# Patient Record
Sex: Female | Born: 1980 | Race: Asian | Hispanic: No | Marital: Married | Smoking: Never smoker
Health system: Southern US, Academic
[De-identification: ages and names within clinical notes are randomized; demographics above are authoritative.]

## PROBLEM LIST (undated history)

## (undated) DIAGNOSIS — I10 Essential (primary) hypertension: Secondary | ICD-10-CM

## (undated) DIAGNOSIS — N390 Urinary tract infection, site not specified: Secondary | ICD-10-CM

## (undated) DIAGNOSIS — F32A Depression, unspecified: Secondary | ICD-10-CM

## (undated) DIAGNOSIS — R3129 Other microscopic hematuria: Secondary | ICD-10-CM

## (undated) DIAGNOSIS — F419 Anxiety disorder, unspecified: Secondary | ICD-10-CM

## (undated) DIAGNOSIS — N3281 Overactive bladder: Secondary | ICD-10-CM

## (undated) HISTORY — DX: Urinary tract infection, site not specified: N39.0

## (undated) HISTORY — DX: Essential (primary) hypertension: I10

## (undated) HISTORY — DX: Anxiety disorder, unspecified: F41.9

## (undated) HISTORY — DX: Other microscopic hematuria: R31.29

## (undated) HISTORY — DX: Depression, unspecified: F32.A

## (undated) HISTORY — DX: Overactive bladder: N32.81

---

## 2016-12-27 IMAGING — CT CT ABDOMEN & PELVIS W/O DYE
2 of 3 series · 17 of 46 positions shown, 19 images · non-contrast
Comparison: none

Exam:   

CT abdomen and pelvis without contrast
INDICATION: Kidney stone.
TECHNIQUE: A CT scan is performed. Multiple axial images are obtained from the dome of the diaphragm to the symphysis. Radiation dose 732 mGy cm.

[axial · axial · 0.63mm/px · z∈[-222,+87]mm · 14 of 119 slices shown, 16 images]
[im 8/119  soft-tissue]
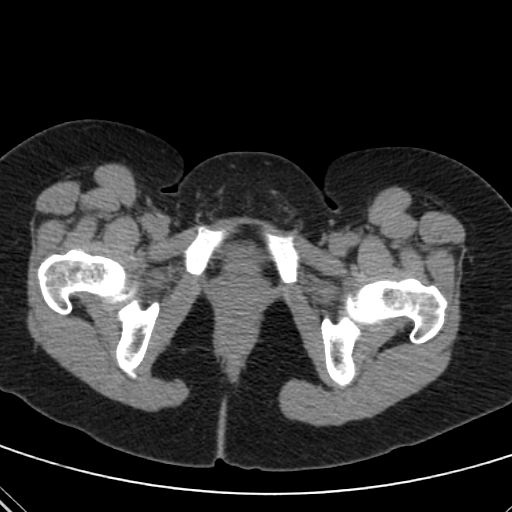
[im 8/119  bone]
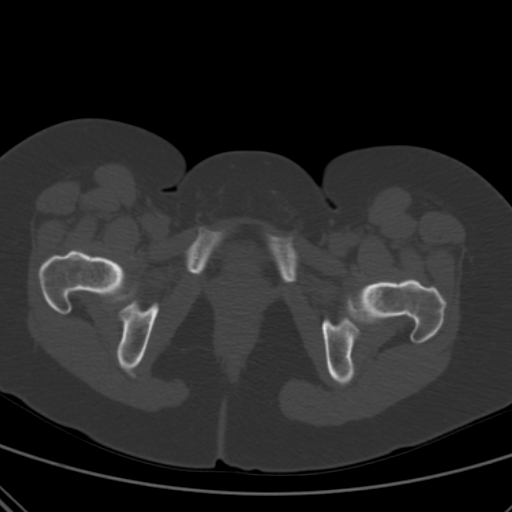
[im 16/119  soft-tissue]
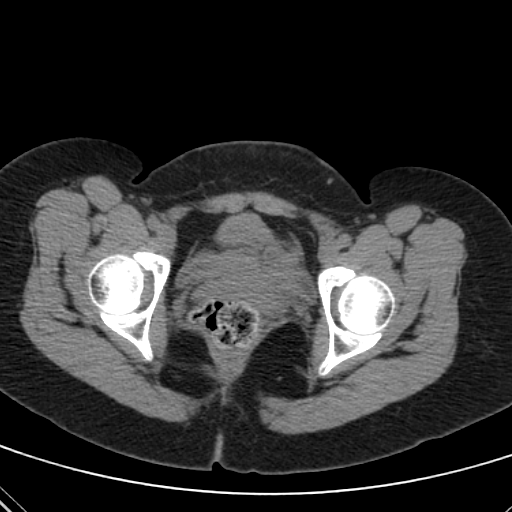
[im 23/119  soft-tissue]
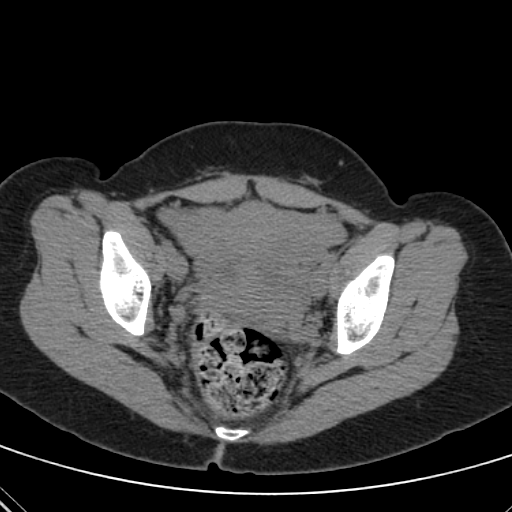
[im 31/119  soft-tissue]
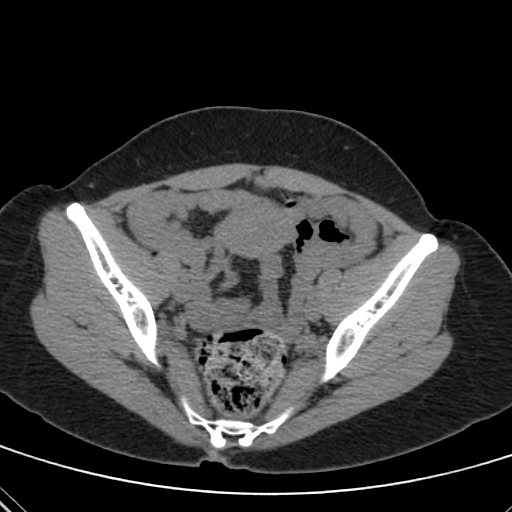
[im 39/119  soft-tissue]
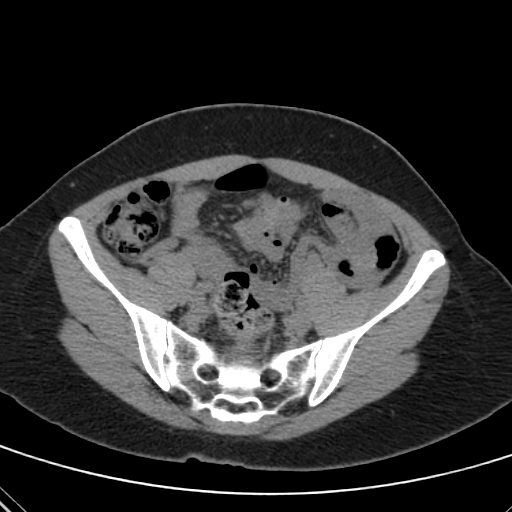
[im 46/119  soft-tissue]
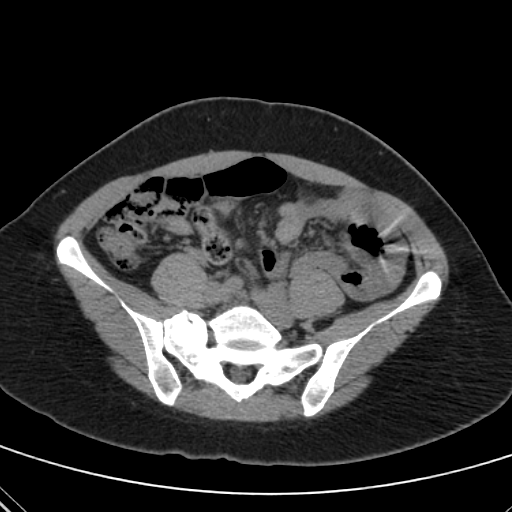
[im 54/119  soft-tissue]
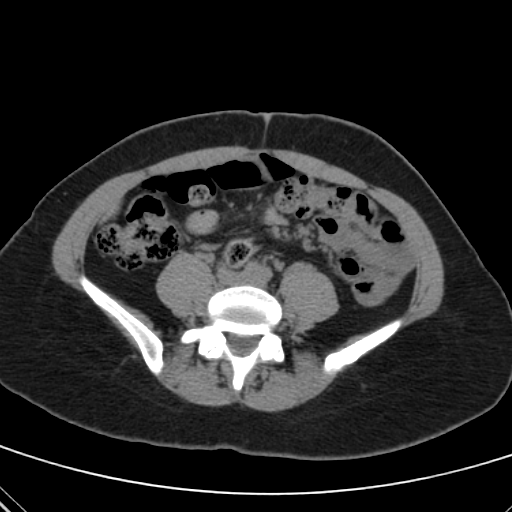
[im 65/119  soft-tissue]
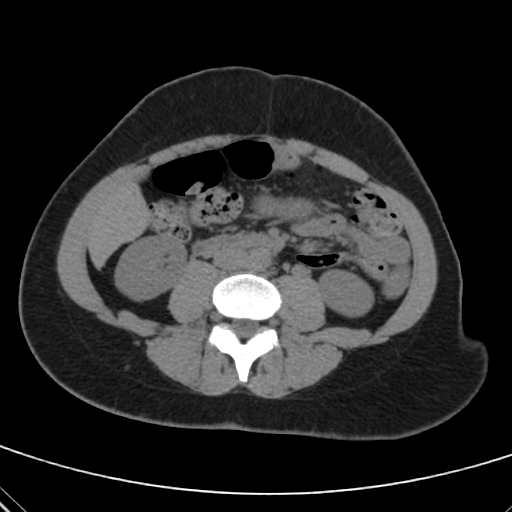
[im 73/119  soft-tissue]
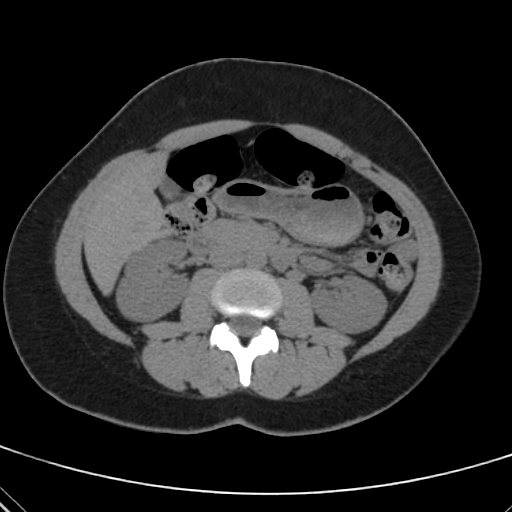
[im 73/119  bone]
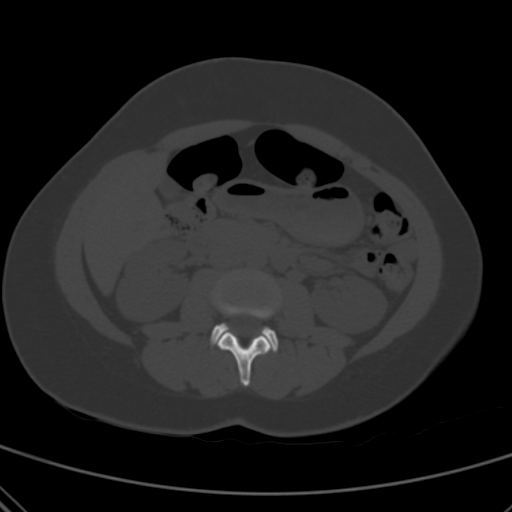
[im 80/119  soft-tissue]
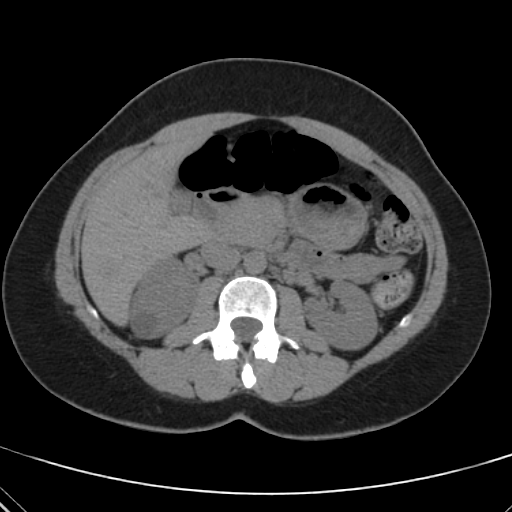
[im 88/119  soft-tissue]
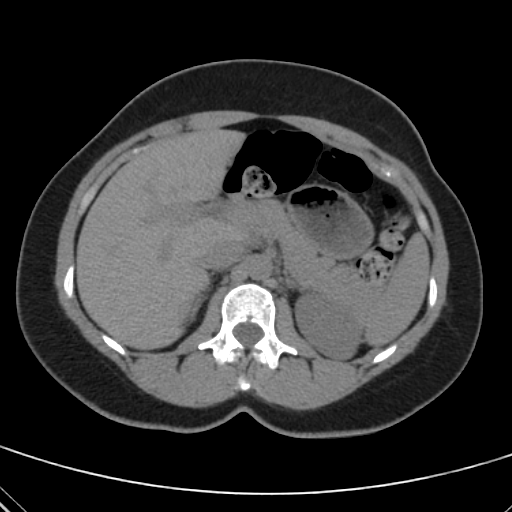
[im 96/119  soft-tissue]
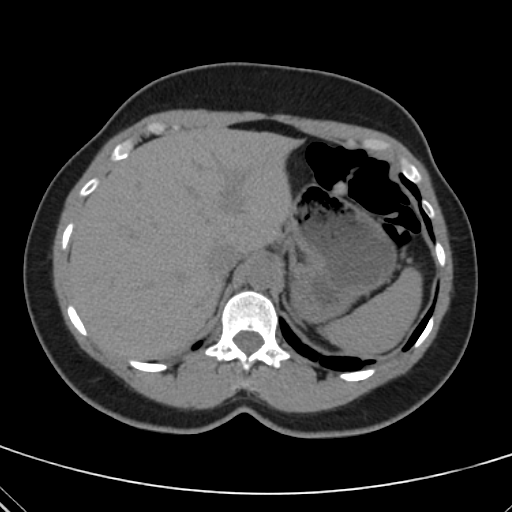
[im 103/119  soft-tissue]
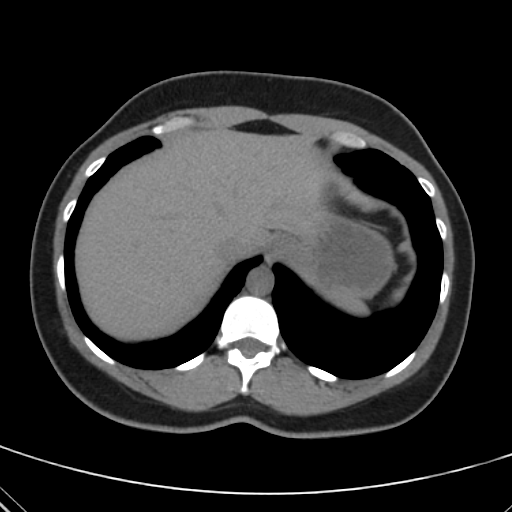
[im 111/119  soft-tissue]
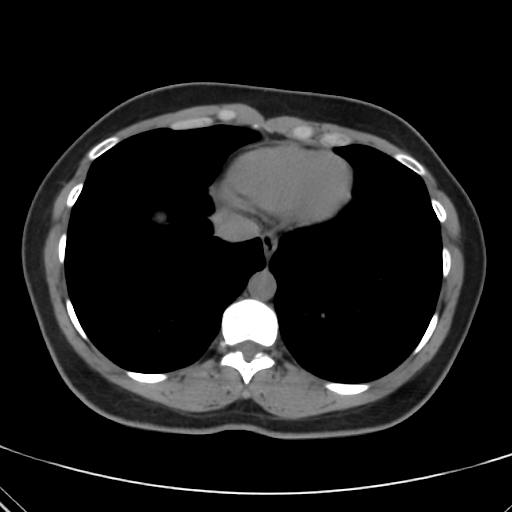

[cor · coronal · 0.74mm/px · 3 of 42 slices shown]
[im 14/42  soft-tissue]
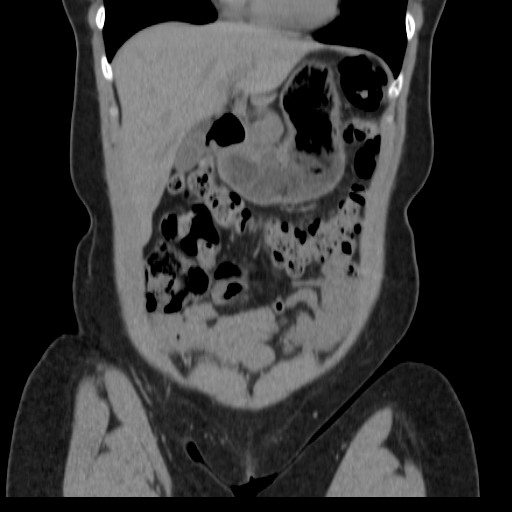
[im 19/42  soft-tissue]
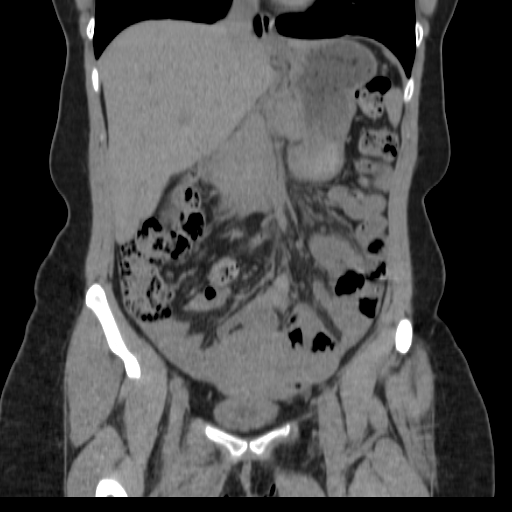
[im 23/42  soft-tissue]
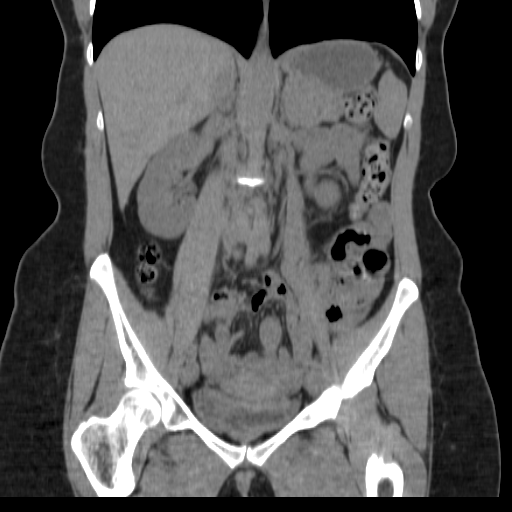

[17 of 46 positions shown; findings below may reference images not displayed]

FINDINGS: The lung bases appear clear. The heart appears normal in size. The attenuation coefficients of the liver, spleen, kidneys and pancreas appear uniform. The adrenal glands are seen bilaterally and appear normal. The gallbladder is contracted but no high attenuation filling defects are seen. No renal calculi or obstructive uropathy is present. A low attenuation area in the right renal cortex is compatible with a cyst. An ultrasound can confirm this finding. The aorta is normal in caliber No dilated loops of bowel; free fluid or free air is seen in the peritoneum. The appendix is not well visualized. No inflammatory changes are seen in the right lower quadrant. 

The uterus is not well visualized due to the occurrence of fluid filled loops of small bowel surrounding the area of the pelvis. No free air or free fluid is seen in the peritoneum. A large amount of stool is noted in the sigmoid colon and rectum.
IMPRESSION: No evidence for renal calculi or an obstructive uropathy. 

Unremarkable ureters and urinary bladder. 

No dilated loops of bowel, free fluid or free air in the peritoneum. 

Normal gallbladder with a non-visualizing appendix. 

Mild degenerative changes are noted in the lumbar spine.

## 2018-03-29 ENCOUNTER — Other Ambulatory Visit (HOSPITAL_COMMUNITY): Payer: Self-pay

## 2021-03-21 ENCOUNTER — Encounter (INDEPENDENT_AMBULATORY_CARE_PROVIDER_SITE_OTHER): Payer: Self-pay | Admitting: Primary Care

## 2021-03-21 ENCOUNTER — Ambulatory Visit: Payer: 59 | Attending: Primary Care | Admitting: Primary Care

## 2021-03-21 ENCOUNTER — Other Ambulatory Visit: Payer: Self-pay

## 2021-03-21 VITALS — BP 126/87 | HR 76 | Temp 98.6°F | Resp 18 | Ht 60.0 in | Wt 138.0 lb

## 2021-03-21 DIAGNOSIS — K1120 Sialoadenitis, unspecified: Secondary | ICD-10-CM | POA: Insufficient documentation

## 2021-03-21 MED ORDER — PREDNISONE 10 MG TABLET
ORAL_TABLET | ORAL | 0 refills | Status: DC
Start: 2021-03-21 — End: 2021-06-19

## 2021-03-21 MED ORDER — AMOXICILLIN 875 MG-POTASSIUM CLAVULANATE 125 MG TABLET
1.0000 | ORAL_TABLET | Freq: Two times a day (BID) | ORAL | 0 refills | Status: AC
Start: 2021-03-21 — End: 2021-03-31

## 2021-03-21 MED ORDER — DEXAMETHASONE SODIUM PHOSPHATE 4 MG/ML INJECTION SOLUTION
8.0000 mg | INTRAMUSCULAR | Status: AC
Start: 2021-03-21 — End: 2021-03-21
  Administered 2021-03-21: 8 mg via INTRAMUSCULAR

## 2021-03-21 NOTE — Nursing Note (Signed)
Travel Screening     Question   Response    In the last 10 days, have you been in contact with someone who was confirmed or suspected to have Coronavirus/COVID-19?  No / Unsure    Have you had a COVID-19 viral test in the last 10 days?  No    Do you have any of the following new or worsening symptoms?      Have you traveled internationally or domestically in the last month?  No      Travel History   Travel since 02/18/21    No documented travel since 02/18/21       Cleta Alberts, LPN  6/77/3736, 16:29

## 2021-03-21 NOTE — Patient Instructions (Signed)
Salivary Gland Infection  Salivary glands make saliva. Saliva is mostly water. It also has minerals and proteins that help break down food and keep the mouth and teeth healthy. There are 3 pairs of salivary glands:    Parotid glands. In front of the ear.   Submandibular glands. Below the jaw.   Sublingual glands. Below the tongue.  A salivary gland can become infected by germs (bacteria). Things that make this more likely include dehydration and taking medicines that affect saliva flow. Infection is also more likely when the tube (duct) that carries saliva from the gland to the mouth is blocked. It may be blocked by a salivary gland stone.This is a collection of minerals that forms in the salivary gland.   Symptoms of infection include fever, severe pain in the gland, and redness and swelling over the gland.It may hurt to open your mouth.Symptoms may be worse when saliva flow is stimulated, such as by the smell of food.   Antibiotics are used to treat the infection. Draining the infection with a simplesurgery may be needed. If you have a salivary gland stone,a procedure may be done to remove it.   Home care   Take antibiotics as directed until they are finished. Do this even if you start to feel better after only a few days.   Unless another medicine was prescribed, take over-the-counter medicines to help ease pain. These include acetaminophen or ibuprofen.   Moist heat can also help ease swelling and pain.Wet a cloth with warm water and put it over the sore gland for 10 to 15 minutes several times a day.   Gently massage the glanda few times a day.   Suck on lemon or other tart hard candiesto make saliva flow.  To help prevent stones and infections:    Drink 6 to 8 glasses of fluid per day (such as water, tea, and clear soup) to keep well-hydrated.   If you smoke,ask your healthcare provider for help toquit. Smoking makes salivary gland stones more likely.   Keep good dental hygiene. Brush  and floss your teeth daily.See your dentist for regular cleanings.  Follow-up care  Follow up with your healthcare provider, or as advised.   When to call your healthcare provider   Call your healthcare provider if any of the following occur:    Fever over100.4F (38C)after 2 days of taking antibiotics   Symptoms that get worse or don't get better in a few days  Call 911  Call 911 if you have trouble breathing or swallowing.   [employer] last reviewed this educational content on 12/07/2018   2000-2021 [employer]. All rights reserved. This information is not intended as a substitute for professional medical care. Always follow your healthcare professional's instructions.

## 2021-03-21 NOTE — Progress Notes (Signed)
URGENT CARE, [facility]  [address] [place] [address]  Phone: [phone]  Fax: [phone]    Encounter Date: 03/21/2021    Patient ID:  Sara Grant  [id number]    DOB: [birth year]  Age: 40 y.o. female    Subjective:     Chief Complaint   Patient presents with    Sore Throat     C/o sore throat x3 days. Pt also states she has a lump there that is concerning.     Patient is here today with above complaint. Denies any fever, nausea, vomiting, or diarrhea. Reports some nasal congestion Sunday and Monday. She took OTC Zicam, which helped. Denies any known exposure to Covid-19. She is eating, drinking, and voiding normally. Denies any problems breathing or swallowing.        Current Outpatient Medications   Medication Sig    amoxicillin-pot clavulanate (AUGMENTIN) 875-125 mg Oral Tablet Take 1 Tablet by mouth Twice daily for 10 days    cholecalciferol, vitamin D3, 250 mcg (10,000 unit) Oral Capsule Take 10,000 Units by mouth Once a day    citalopram (CELEXA) 40 mg Oral Tablet Take 40 mg by mouth Once a day    loratadine (CLARITIN) 10 mg Oral Tablet Take 10 mg by mouth Once a day    LORazepam (ATIVAN) 0.5 mg Oral Tablet Take 0.5 mg by mouth Twice per day as needed    losartan-hydrochlorothiazide (HYZAAR) 50-12.5 mg Oral Tablet     predniSONE (DELTASONE) 10 mg Oral Tablet Taper 5,4,3,2,1    propranoloL (INDERAL) 40 mg Oral Tablet Take 40 mg by mouth Once a day    tamsulosin (FLOMAX) 0.4 mg Oral Capsule Take 0.4 mg by mouth Once a day    VOLNEA, 28, 0.15-0.02 mgx21 /0.01 mg x 5 Oral Tablet      No Known Allergies  Past Medical History:   Diagnosis Date    Anxiety     Depression     Hypertension          Past Surgical History:   Procedure Laterality Date    HX CESAREAN SECTION           Family Medical History:    None         Social History     Tobacco Use    Smoking status: Never Smoker    Smokeless tobacco: Never Used   Vaping Use    Vaping Use: Never used   Substance Use  Topics    Alcohol use: Yes     Comment: Occasional    Drug use: Never       Review of Systems   HENT: Positive for sore throat.    All other systems reviewed and are negative.    Objective:   Vitals: BP 126/87 (Site: Left, Patient Position: Sitting, Cuff Size: Adult)    Pulse 76    Temp 37 C (98.6 F)    Resp 18    Ht 1.524 m (5')    Wt 62.6 kg (138 lb)    LMP 02/06/2021 (Approximate)    SpO2 98%    Breastfeeding No    BMI 26.95 kg/m         Physical Exam  Vitals and nursing note reviewed.   Constitutional:       General: She is not in acute distress.     Appearance: Normal appearance. She is normal weight. She is not ill-appearing, toxic-appearing or diaphoretic.   HENT:  Head: Normocephalic.      Nose: Nose normal.      Mouth/Throat:      Lips: Pink.      Mouth: Mucous membranes are moist.      Pharynx: Oropharynx is clear. Uvula midline. No pharyngeal swelling, oropharyngeal exudate, posterior oropharyngeal erythema or uvula swelling.      Tonsils: No tonsillar exudate or tonsillar abscesses.   Eyes:      General:         Right eye: No discharge.         Left eye: No discharge.      Conjunctiva/sclera: Conjunctivae normal.   Neck:        Comments: Submandibular gland swelling  Cardiovascular:      Rate and Rhythm: Normal rate and regular rhythm.      Heart sounds: Normal heart sounds.   Pulmonary:      Effort: Pulmonary effort is normal. No respiratory distress.      Breath sounds: Normal breath sounds.   Musculoskeletal:      Cervical back: Normal range of motion and neck supple.   Skin:     General: Skin is warm and dry.   Neurological:      Mental Status: She is alert and oriented to person, place, and time.      Gait: Gait normal.   Psychiatric:         Behavior: Behavior normal.         Assessment & Plan:     ENCOUNTER DIAGNOSES     ICD-10-CM   1. Submandibular gland infection  K11.20       Orders Placed This Encounter    dexamethasone 4 mg/mL injection    amoxicillin-pot clavulanate (AUGMENTIN)  875-125 mg Oral Tablet    predniSONE (DELTASONE) 10 mg Oral Tablet     Patient Instructions     Salivary Gland Infection  Salivary glands make saliva. Saliva is mostly water. It also has minerals and proteins that help break down food and keep the mouth and teeth healthy. There are 3 pairs of salivary glands:    Parotid glands. In front of the ear.   Submandibular glands. Below the jaw.   Sublingual glands. Below the tongue.  A salivary gland can become infected by germs (bacteria). Things that make this more likely include dehydration and taking medicines that affect saliva flow. Infection is also more likely when the tube (duct) that carries saliva from the gland to the mouth is blocked. It may be blocked by a salivary gland stone.This is a collection of minerals that forms in the salivary gland.   Symptoms of infection include fever, severe pain in the gland, and redness and swelling over the gland.It may hurt to open your mouth.Symptoms may be worse when saliva flow is stimulated, such as by the smell of food.   Antibiotics are used to treat the infection. Draining the infection with a simplesurgery may be needed. If you have a salivary gland stone,a procedure may be done to remove it.   Home care   Take antibiotics as directed until they are finished. Do this even if you start to feel better after only a few days.   Unless another medicine was prescribed, take over-the-counter medicines to help ease pain. These include acetaminophen or ibuprofen.   Moist heat can also help ease swelling and pain.Wet a cloth with warm water and put it over the sore gland for 10 to 15 minutes several times a day.  Gently massage the glanda few times a day.   Suck on lemon or other tart hard candiesto make saliva flow.  To help prevent stones and infections:    Drink 6 to 8 glasses of fluid per day (such as water, tea, and clear soup) to keep well-hydrated.   If you smoke,ask your healthcare provider for  help toquit. Smoking makes salivary gland stones more likely.   Keep good dental hygiene. Brush and floss your teeth daily.See your dentist for regular cleanings.  Follow-up care  Follow up with your healthcare provider, or as advised.   When to call your healthcare provider   Call your healthcare provider if any of the following occur:    Fever over100.30F (38C)after 2 days of taking antibiotics   Symptoms that get worse or don't get better in a few days  Call 911  Call 911 if you have trouble breathing or swallowing.   [employer]Well last reviewed this educational content on 12/07/2018   2000-2021 [employer] [place]. All rights reserved. This information is not intended as a substitute for professional medical care. Always follow your healthcare professional's instructions.            Return if symptoms worsen or fail to improve.    Adele Dan, NP

## 2021-06-19 ENCOUNTER — Other Ambulatory Visit: Payer: Self-pay

## 2021-06-19 ENCOUNTER — Encounter (INDEPENDENT_AMBULATORY_CARE_PROVIDER_SITE_OTHER): Payer: Self-pay | Admitting: NURSE PRACTITIONER

## 2021-06-19 ENCOUNTER — Ambulatory Visit: Payer: 59 | Attending: NURSE PRACTITIONER | Admitting: NURSE PRACTITIONER

## 2021-06-19 VITALS — BP 133/83 | HR 68 | Temp 96.8°F | Resp 19 | Ht 60.0 in | Wt 138.0 lb

## 2021-06-19 DIAGNOSIS — J020 Streptococcal pharyngitis: Secondary | ICD-10-CM | POA: Insufficient documentation

## 2021-06-19 DIAGNOSIS — J029 Acute pharyngitis, unspecified: Secondary | ICD-10-CM | POA: Insufficient documentation

## 2021-06-19 DIAGNOSIS — J040 Acute laryngitis: Secondary | ICD-10-CM | POA: Insufficient documentation

## 2021-06-19 DIAGNOSIS — R059 Cough, unspecified: Secondary | ICD-10-CM | POA: Insufficient documentation

## 2021-06-19 LAB — POCT RAPID STREP A: RAPID STREP A (POCT): POSITIVE

## 2021-06-19 LAB — POCT RAPID COVID (SOFIA) (AMB ONLY): COVID-19 AG: NEGATIVE

## 2021-06-19 MED ORDER — AMOXICILLIN 875 MG-POTASSIUM CLAVULANATE 125 MG TABLET
1.0000 | ORAL_TABLET | Freq: Two times a day (BID) | ORAL | 0 refills | Status: AC
Start: 2021-06-19 — End: 2021-07-03

## 2021-06-19 MED ORDER — DEXAMETHASONE SODIUM PHOSPHATE 4 MG/ML INJECTION SOLUTION
8.0000 mg | INTRAMUSCULAR | Status: AC
Start: 2021-06-19 — End: 2021-06-19
  Administered 2021-06-19 (×2): 8 mg via INTRAMUSCULAR

## 2021-06-19 MED ORDER — DEXAMETHASONE 6 MG TABLET
6.0000 mg | ORAL_TABLET | Freq: Every day | ORAL | 0 refills | Status: AC
Start: 2021-06-19 — End: 2021-06-24

## 2021-06-19 NOTE — Nursing Note (Signed)
Travel Screening     Question   Response    In the last 10 days, have you been in contact with someone who was confirmed or suspected to have Coronavirus/COVID-19?  No / Unsure    Have you had a COVID-19 viral test in the last 10 days?  No    Do you have any of the following new or worsening symptoms?  None of these    Have you traveled internationally or domestically in the last month?  No      Travel History   Travel since 05/19/21    No documented travel since 05/19/21           Cleta Alberts, LPN  11/07/8655, 17:42

## 2021-06-19 NOTE — Progress Notes (Signed)
URGENT CARE, [facility]  [address] [place] [address]  Phone: [phone]  Fax: [phone]    Encounter Date: 06/19/2021    Patient ID:  Sara Grant  YTK:[id number]    DOB: June 01, 1981  Age: 40 y.o. female    Subjective:     Chief Complaint   Patient presents with   . Sore Throat     C/o sore throat x2 days.   . Cough     C/o non-productive cough.     The patient presents to the clinic with c/o sorethroat, scratchy throat, clear nasal drainage, body aches 6/10 aching, since Saturday. She states she has been taking dayquil and zycam at home with minimal relief. Then this am woke up hoarse and unable to project voice.     The history is provided by the patient.     Current Outpatient Medications   Medication Sig   . amoxicillin-pot clavulanate (AUGMENTIN) 875-125 mg Oral Tablet Take 1 Tablet by mouth Twice daily for 14 days   . cholecalciferol, vitamin D3, 250 mcg (10,000 unit) Oral Capsule Take 10,000 Units by mouth Once a day   . citalopram (CELEXA) 40 mg Oral Tablet Take 40 mg by mouth Once a day   . Dexamethasone (DECADRON) 6 mg Oral Tablet Take 1 Tablet (6 mg total) by mouth Once a day for 5 days   . loratadine (CLARITIN) 10 mg Oral Tablet Take 10 mg by mouth Once a day   . LORazepam (ATIVAN) 0.5 mg Oral Tablet Take 0.5 mg by mouth Twice per day as needed   . losartan-hydrochlorothiazide (HYZAAR) 50-12.5 mg Oral Tablet    . propranoloL (INDERAL) 40 mg Oral Tablet Take 40 mg by mouth Once a day   . tamsulosin (FLOMAX) 0.4 mg Oral Capsule Take 0.4 mg by mouth Once a day   . VOLNEA, 28, 0.15-0.02 mgx21 /0.01 mg x 5 Oral Tablet  (Patient not taking: Reported on 06/19/2021)     No Known Allergies  Past Medical History:   Diagnosis Date   . Anxiety    . Depression    . Hypertension          Past Surgical History:   Procedure Laterality Date   . HX CESAREAN SECTION           Family Medical History:    None         Social History     Tobacco Use   . Smoking status: Never Smoker   . Smokeless  tobacco: Never Used   Vaping Use   . Vaping Use: Never used   Substance Use Topics   . Alcohol use: Yes     Comment: Occasional   . Drug use: Never       Review of Systems   Constitutional: Positive for chills and fatigue. Negative for fever.   HENT: Positive for ear pain and sore throat. Negative for congestion, sinus pressure and sinus pain.    Eyes: Negative for pain and discharge.   Respiratory: Negative for cough, chest tightness and shortness of breath.    Cardiovascular: Negative for chest pain and leg swelling.   Gastrointestinal: Positive for vomiting (gerd like reflux ). Negative for abdominal pain, diarrhea and nausea.   Genitourinary: Negative for difficulty urinating, dysuria and flank pain.   Musculoskeletal: Positive for arthralgias and myalgias.   Skin: Negative for rash and wound.     Objective:   Vitals: BP 133/83 (Site: Left, Patient Position: Sitting,  Cuff Size: Adult)   Pulse 68   Temp 36 C (96.8 F)   Resp 19   Ht 1.524 m (5')   Wt 62.6 kg (138 lb)   LMP 05/22/2021   SpO2 97%   Breastfeeding No   BMI 26.95 kg/m         Physical Exam  Constitutional:       General: She is not in acute distress.     Appearance: Normal appearance. She is normal weight. She is not ill-appearing or toxic-appearing.   HENT:      Head: Normocephalic and atraumatic.      Right Ear: Tympanic membrane normal.      Left Ear: Tympanic membrane normal.      Nose: Nose normal.      Mouth/Throat:      Mouth: Mucous membranes are moist.      Pharynx: Uvula midline. Pharyngeal swelling, oropharyngeal exudate and posterior oropharyngeal erythema present.      Tonsils: Tonsillar exudate present. 1+ on the right. 1+ on the left.   Eyes:      Pupils: Pupils are equal, round, and reactive to light.   Cardiovascular:      Rate and Rhythm: Normal rate and regular rhythm.      Pulses: Normal pulses.      Heart sounds: Normal heart sounds. No murmur heard.    No gallop.   Pulmonary:      Effort: Pulmonary effort is normal. No  respiratory distress.      Breath sounds: Normal breath sounds. No wheezing, rhonchi or rales.   Abdominal:      General: Abdomen is flat. Bowel sounds are normal.      Palpations: Abdomen is soft. There is no mass.      Tenderness: There is no abdominal tenderness. There is no guarding.      Hernia: No hernia is present.   Musculoskeletal:      Cervical back: Normal range of motion.   Skin:     General: Skin is warm and dry.      Findings: No erythema or rash.   Neurological:      General: No focal deficit present.      Mental Status: She is alert.   Psychiatric:         Mood and Affect: Mood normal.       Office Visit on 06/19/2021   Component Date Value Ref Range Status   . COVID-19 AG 06/19/2021 Negative  Negative Final   . INTERNAL CONTROL 06/19/2021 Valid   Final   . RAPID STREP A (POCT) 06/19/2021 Positive   Final   . INTERNAL CONTROL 06/19/2021 Valid   Final       Assessment & Plan:     ENCOUNTER DIAGNOSES     ICD-10-CM   1. Strep pharyngitis  J02.0   2. Sore throat  J02.9   3. Cough  R05.9   4. Laryngitis  J04.0       Orders Placed This Encounter   . POCT Rapid Covid ([facility]ofia) (AMB Only)   . POCT RAPID STREP A   . dexamethasone 4 mg/mL injection   . Dexamethasone (DECADRON) 6 mg Oral Tablet   . amoxicillin-pot clavulanate (AUGMENTIN) 875-125 mg Oral Tablet   Discussed results with the patient, otherwise she is strep positive.    Will provide work excuse.  Rest, increase fluids.  Tylenol or Motrin over-the-counter as needed for pain, fever   Advance diet as tolerated  Administer an office Decadron IM  Start Decadron oral tomorrow  Take antibiotics as directed, do not skip, do not miss, do not stop abruptly, discussed antibiotic stewardship   Discussed patient's results, and plan of care, patient felt stable for discharge.  Patient verbalized understanding was agreeable to plan of care.  Follow-up as needed, return as needed.    Return for Follow up with PCP for new/worsening concerns, Return for new or  worsening concerns.    Lance Muss, APRN

## 2022-01-08 ENCOUNTER — Other Ambulatory Visit: Payer: BLUE CROSS/BLUE SHIELD | Attending: Family

## 2022-01-08 ENCOUNTER — Other Ambulatory Visit: Payer: Self-pay

## 2022-01-08 DIAGNOSIS — E785 Hyperlipidemia, unspecified: Secondary | ICD-10-CM | POA: Insufficient documentation

## 2022-01-08 DIAGNOSIS — T50905A Adverse effect of unspecified drugs, medicaments and biological substances, initial encounter: Secondary | ICD-10-CM | POA: Insufficient documentation

## 2022-01-08 DIAGNOSIS — I10 Essential (primary) hypertension: Secondary | ICD-10-CM | POA: Insufficient documentation

## 2022-01-08 DIAGNOSIS — R319 Hematuria, unspecified: Secondary | ICD-10-CM | POA: Insufficient documentation

## 2022-01-08 LAB — LIPID PANEL
CHOL/HDL RATIO: 4.6
CHOLESTEROL: 224 mg/dL (ref 136–290)
HDL CHOL: 49 mg/dL (ref 23–92)
LDL CALC: 149 mg/dL — ABNORMAL HIGH (ref 0–100)
TRIGLYCERIDES: 131 mg/dL (ref <=150)
VLDL CALC: 26 mg/dL (ref 0–50)

## 2022-01-08 LAB — URINALYSIS, MACROSCOPIC
BILIRUBIN: NEGATIVE mg/dL
BLOOD: 1 mg/dL — AB
GLUCOSE: NEGATIVE mg/dL
KETONES: NEGATIVE mg/dL
LEUKOCYTES: NEGATIVE WBCs/uL
NITRITE: NEGATIVE
PH: 6 (ref 5.0–9.0)
PROTEIN: 100 mg/dL — AB
SPECIFIC GRAVITY: 1.008 (ref 1.002–1.030)
UROBILINOGEN: NORMAL mg/dL

## 2022-01-08 LAB — HEPATIC FUNCTION PANEL
ALBUMIN/GLOBULIN RATIO: 1.4 (ref 0.8–1.4)
ALBUMIN: 3.9 g/dL (ref 3.5–5.7)
ALKALINE PHOSPHATASE: 39 U/L (ref 34–104)
ALT (SGPT): 17 U/L (ref 7–52)
AST (SGOT): 17 U/L (ref 13–39)
BILIRUBIN DIRECT: 0.04 md/dL (ref <=0.20)
BILIRUBIN TOTAL: 0.5 mg/dL (ref 0.3–1.2)
BILIRUBIN, INDIRECT: 0.46 mg/dL (ref <=1)
GLOBULIN: 2.8 — ABNORMAL LOW (ref 2.9–5.4)
PROTEIN TOTAL: 6.7 g/dL (ref 6.4–8.9)

## 2022-01-08 LAB — ELECTROLYTES
ANION GAP: 8 mmol/L — ABNORMAL LOW (ref 10–20)
CHLORIDE: 107 mmol/L (ref 98–107)
CO2 TOTAL: 23 mmol/L (ref 21–31)
POTASSIUM: 4 mmol/L (ref 3.5–5.1)
SODIUM: 138 mmol/L (ref 136–145)

## 2022-01-08 LAB — URINALYSIS, MICROSCOPIC
RBCS: 16 /hpf — ABNORMAL HIGH (ref <4)
SQUAMOUS EPITHELIAL: <1 /hpf (ref <28)
WBCS: 1 /hpf (ref <6)

## 2022-02-15 IMAGING — MR MRI ABDOMEN WITHOUT AND WITH CONTRAST
14 series · 48 of 48 positions shown · IV contrast (gadavist)
Comparison: CT abdomen pelvis dated 12/27/2016.

﻿EXAM:  [id number]   MRI ABDOMEN WITHOUT AND WITH CONTRAST
INDICATION: History of pancreatic cyst.
TECHNIQUE: Multiplanar multisequential MRI of the abdomen was performed without and with 5 mL of Gadavist.

[Series 7: cor basg bh · coronal · 7.0mm · 1.25mm/px · 3 of 24 slices shown]
[im 1/24]
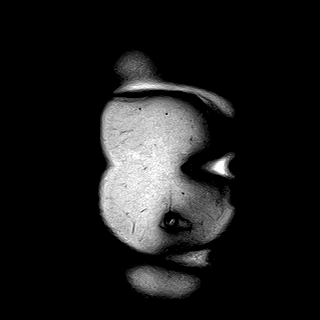
[im 12/24]
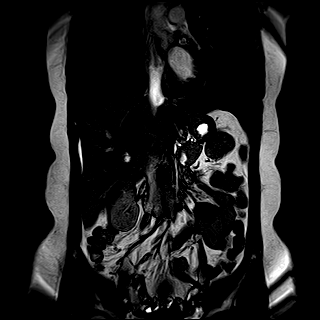
[im 24/24]
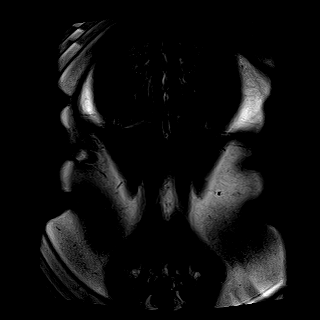

[Series 8: axial in/out phase · axial · 7.5mm · 1.56mm/px · z∈[-106,+106]mm · 3 of 26 slices shown (1 of 2)]
[im 1/26]
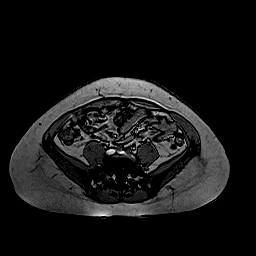
[im 13/26]
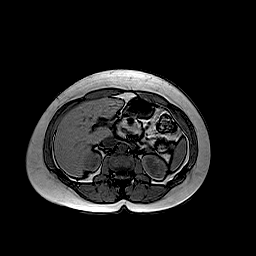
[im 26/26]
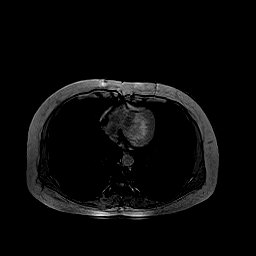

[Series 9: axial in/out phase · axial · 7.5mm · 1.56mm/px · z∈[-106,+106]mm · 2 of 26 slices shown (2 of 2)]
[im 1/26]
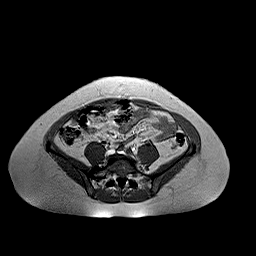
[im 26/26]
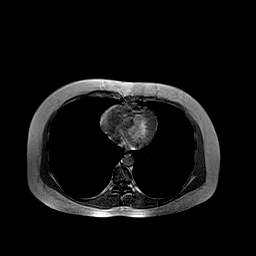

[Series 10: T2 · axial · 7.5mm · 1.19mm/px · z∈[-98,+98]mm · 2 of 24 slices shown (1 of 3)]
[im 1/24]
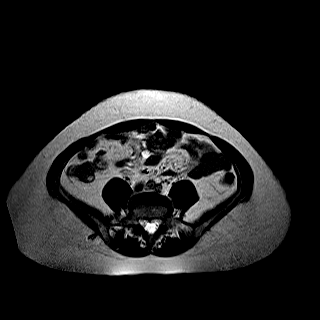
[im 24/24]
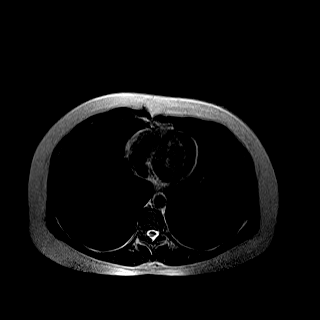

[Series 11: T2 fat-sat · axial · 7.5mm · 1.79mm/px · z∈[-106,+106]mm · 2 of 26 slices shown]
[im 1/26]
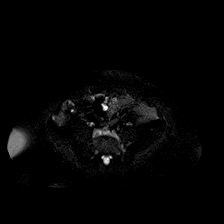
[im 26/26]
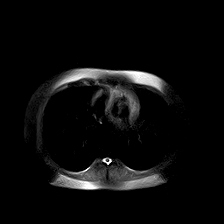

[Series 12: T2 · axial · 7.5mm · 1.56mm/px · z∈[-106,+106]mm · 2 of 26 slices shown (2 of 3)]
[im 1/26]
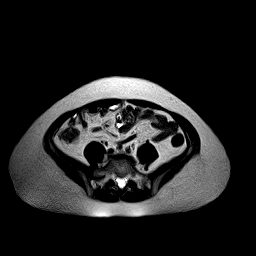
[im 26/26]
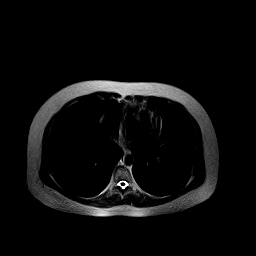

[Series 13: T1 · axial · 7.0mm · 1.48mm/px · z∈[-92,+92]mm · 2 of 24 slices shown (1 of 2)]
[im 1/24]
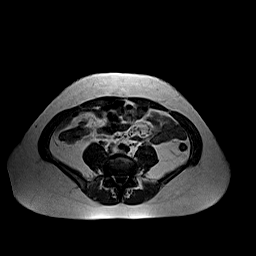
[im 24/24]
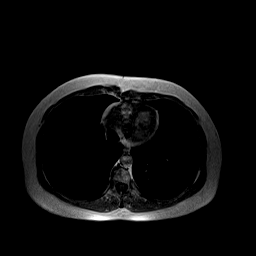

[Series 14: axial basg bh · axial · 7.5mm · 1.25mm/px · z∈[-106,+106]mm · 2 of 26 slices shown]
[im 1/26]
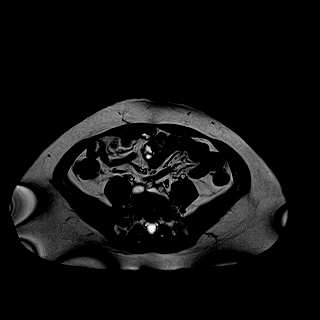
[im 26/26]
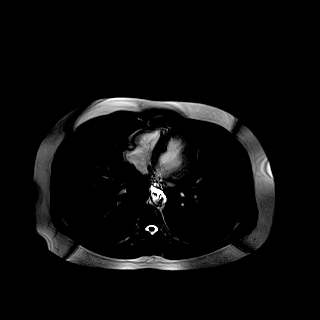

[Series 15: T1 · coronal · 6.0mm · 1.56mm/px · 2 of 24 slices shown (2 of 2)]
[im 1/24]
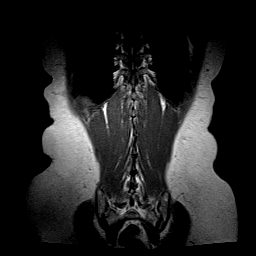
[im 24/24]
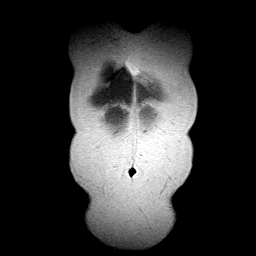

[Series 16: T2 · coronal · 7.0mm · 1.39mm/px · 2 of 24 slices shown (3 of 3)]
[im 1/24]
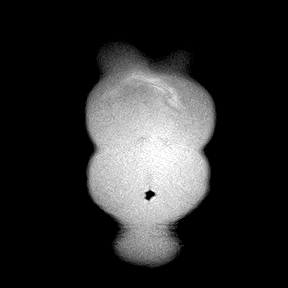
[im 24/24]
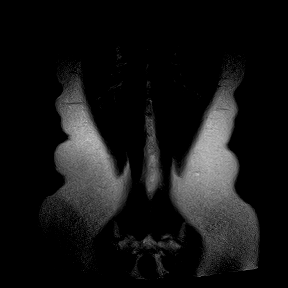

[Series 17: (person_name) · coronal · 6.0mm · 1.25mm/px · 7 of 72 slices shown (1 of 2)]
[im 1/72]
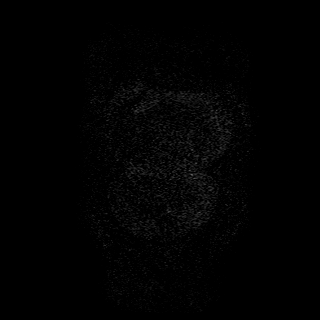
[im 12/72]
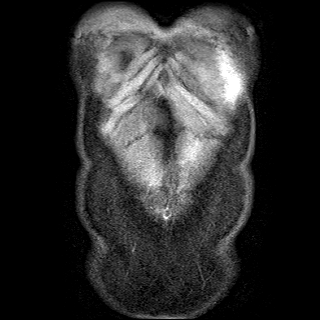
[im 24/72]
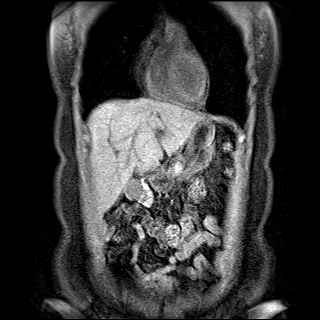
[im 36/72]
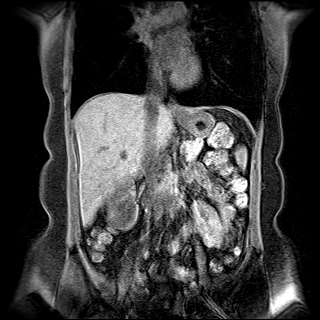
[im 48/72]
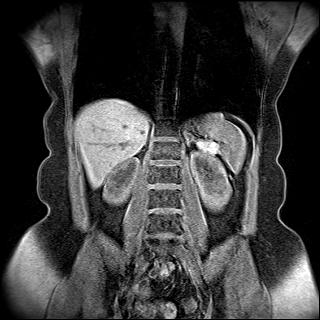
[im 60/72]
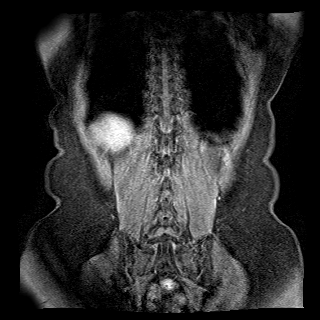
[im 72/72]
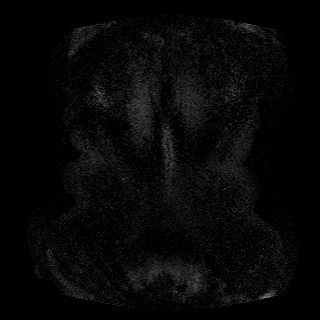

[Series 18: axial (person_name) · axial · 7.5mm · 1.19mm/px · z∈[-118,+118]mm · 6 of 64 slices shown (1 of 2)]
[im 1/64]
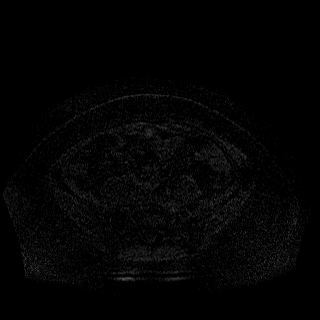
[im 13/64]
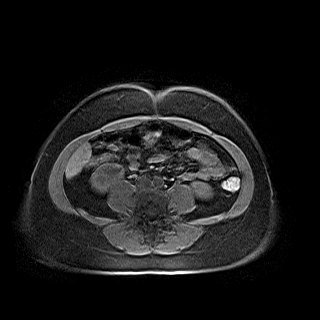
[im 26/64]
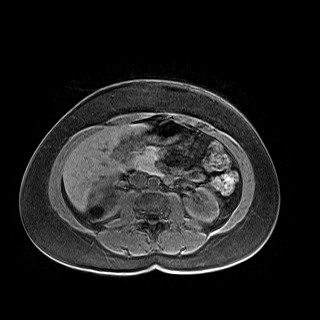
[im 38/64]
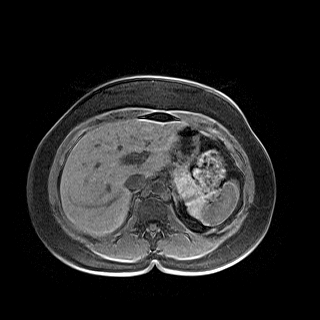
[im 51/64]
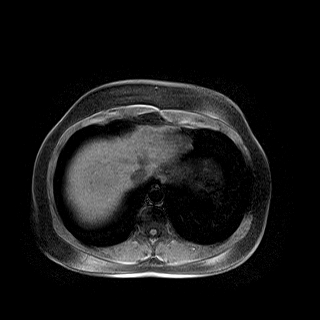
[im 64/64]
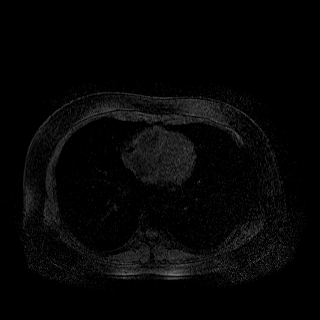

[Series 19: axial (person_name) · axial · 7.5mm · 1.19mm/px · z∈[-118,+118]mm · 6 of 64 slices shown (2 of 2)]
[im 1/64]
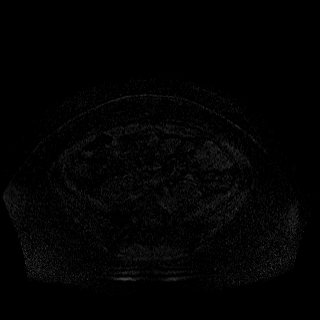
[im 13/64]
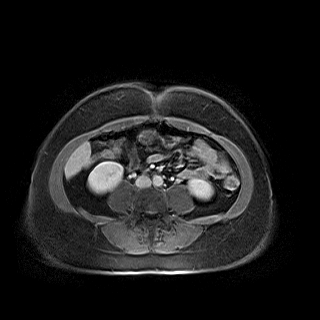
[im 26/64]
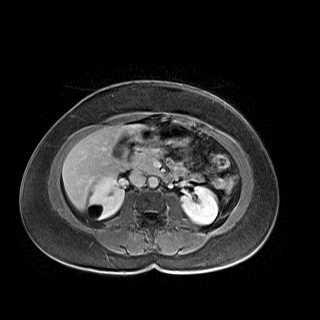
[im 38/64]
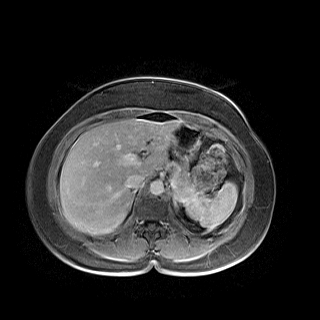
[im 51/64]
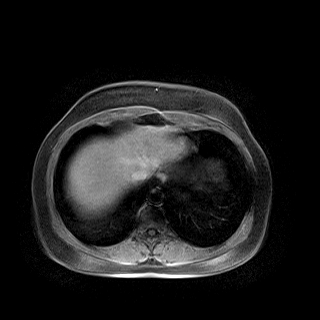
[im 64/64]
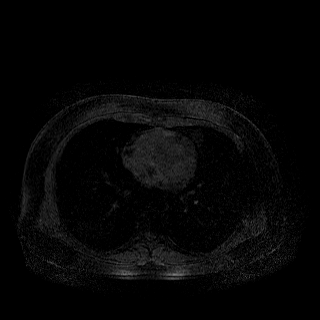

[Series 20: (person_name) · coronal · 6.0mm · 1.25mm/px · 7 of 72 slices shown (2 of 2)]
[im 1/72]
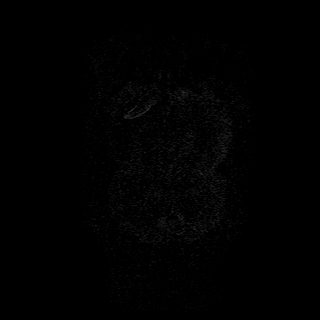
[im 12/72]
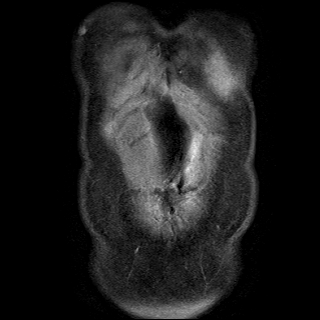
[im 24/72]
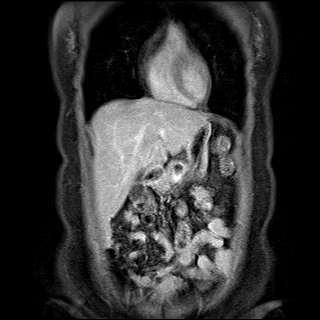
[im 36/72]
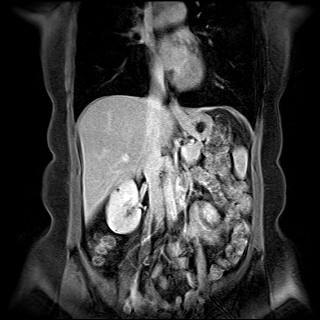
[im 48/72]
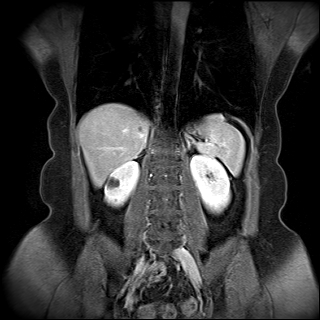
[im 60/72]
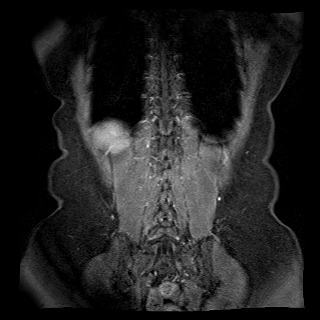
[im 72/72]
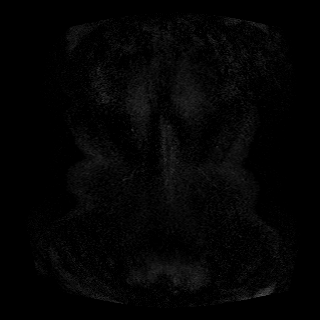

[48 of 48 positions shown; findings below may reference images not displayed]

FINDINGS: Liver is fatty and borderline enlarged measuring 17.5 cm in maximum craniocaudal dimension. There is a 15 mm proximal pancreatic body cyst and a 2 cm right kidney upper pole cyst.  Gallbladder, spleen, adrenal glands and left kidney are normal. Following intravenous contrast administration, there is no abnormal enhancement.  No pleural effusion or upper abdominal ascites is seen. There is no retroperitoneal adenopathy.
IMPRESSION: 1. Pancreatic and right renal cysts as detailed above. 

2. Fatty and borderline enlarged liver.

## 2022-04-04 ENCOUNTER — Other Ambulatory Visit: Payer: Self-pay

## 2022-04-05 ENCOUNTER — Other Ambulatory Visit: Payer: BLUE CROSS/BLUE SHIELD | Attending: Family | Admitting: Family

## 2022-04-05 ENCOUNTER — Encounter (INDEPENDENT_AMBULATORY_CARE_PROVIDER_SITE_OTHER): Payer: Self-pay | Admitting: Family

## 2022-04-05 ENCOUNTER — Ambulatory Visit (INDEPENDENT_AMBULATORY_CARE_PROVIDER_SITE_OTHER): Payer: BLUE CROSS/BLUE SHIELD | Admitting: Family

## 2022-04-05 VITALS — BP 120/77 | HR 85 | Resp 17 | Ht 60.0 in | Wt 138.4 lb

## 2022-04-05 DIAGNOSIS — R319 Hematuria, unspecified: Secondary | ICD-10-CM

## 2022-04-05 DIAGNOSIS — N390 Urinary tract infection, site not specified: Secondary | ICD-10-CM

## 2022-04-05 DIAGNOSIS — R3129 Other microscopic hematuria: Secondary | ICD-10-CM

## 2022-04-05 DIAGNOSIS — N281 Cyst of kidney, acquired: Secondary | ICD-10-CM

## 2022-04-05 LAB — POCT URINE DIPSTICK
BILIRUBIN: NEGATIVE
GLUCOSE: NEGATIVE
KETONE: NEGATIVE
LEUKOCYTES: NEGATIVE
NITRITE: NEGATIVE
PH: 5.5
PROTEIN: 300
SPECIFIC GRAVITY: 1.03
UROBILINOGEN: 0.2

## 2022-04-05 NOTE — Progress Notes (Signed)
UROLOGY, [facility]  [address] [place] [address]      History and Physical Notes     Name: Sara Grant MRN:  [id number]   Date: 04/05/2022 Age/DOB:41 y.o. [birth year]        Name: Sara Grant                       Date of Birth: [birth year]   MRN:  [id number]                         Date of visit: 04/05/2022     PCP: Lacretia Leigh., MD   Referring Provider: No referring provider defined for this encounter.     HPI:  Sara Grant is a 41 y.o. female who presents with Renal Cyst (New patient, ref for renal cyst. Complains of right back pain. Also states she has a hx of UTIs and hematuria)   to clinic.      She was referred for renal cyst. 02/15/22 MRI abd without and with contrast at [facility] showed a 2 cm right kidney upper pole cyst. She reports a history of microscopic hematuria, UTIs and right pyelonephritis dating back to her childhood. She has right low back pain and painful urination with UTIs. UA 04/05/22 showed large blood.    Past Medical History  Current Outpatient Medications   Medication Sig   . cholecalciferol, vitamin D3, 250 mcg (10,000 unit) Oral Capsule Take 1 Capsule (10,000 Units total) by mouth Once a day   . citalopram (CELEXA) 40 mg Oral Tablet Take 1 Tablet (40 mg total) by mouth Once a day   . loratadine (CLARITIN) 10 mg Oral Tablet Take 1 Tablet (10 mg total) by mouth Once a day   . LORazepam (ATIVAN) 0.5 mg Oral Tablet Take 1 Tablet (0.5 mg total) by mouth Twice per day as needed   . losartan-hydrochlorothiazide (HYZAAR) 50-12.5 mg Oral Tablet    . propranoloL (INDERAL) 40 mg Oral Tablet Take 1 Tablet (40 mg total) by mouth Once a day     No Known Allergies  Past Medical History:   Diagnosis Date   . Anxiety    . Chronic urinary tract infection    . Depression    . Hypertension    . Microscopic hematuria    . OAB (overactive bladder)          Past Surgical History:   Procedure Laterality Date   . HX CESAREAN SECTION      x2         Family  Medical History:     Problem Relation (Age of Onset)    Benign prostatic hyperplasia Father    Diabetes Other    Hypertension (High Blood Pressure) Other          Social History     Socioeconomic History   . Marital status: Married   Tobacco Use   . Smoking status: Never   . Smokeless tobacco: Never   Vaping Use   . Vaping Use: Never used   Substance and Sexual Activity   . Alcohol use: Yes     Comment: Occasional   . Drug use: Never     Social Determinants of Health     Financial Resource Strain: Low Risk    . SDOH Financial: No   Transportation Needs: Low Risk    . SDOH Transportation: No   Social Connections: Low Risk    .  SDOH Social Isolation: 5 or more times a week   Intimate Partner Violence: Low Risk    . SDOH Domestic Violence: No   Housing Stability: Low Risk    . SDOH Housing Situation: I have housing.   Marland Kitchen SDOH Housing Worry: No        Patient Active Problem List    Diagnosis Date Noted   . Renal cyst 04/05/2022   . Hematuria 04/05/2022        REVIEW OF SYSTEMS:   As per HPI.    Physical Exam  Constitutional:       General: She is not in acute distress.     Appearance: She is not ill-appearing.   Pulmonary:      Effort: Pulmonary effort is normal. No respiratory distress.   Neurological:      Mental Status: She is alert.   Psychiatric:         Mood and Affect: Mood normal.         Behavior: Behavior normal.       BP 120/77   Pulse 85   Resp 17   Ht 1.524 m (5')   Wt 62.8 kg (138 lb 6.4 oz)   SpO2 97%   BMI 27.03 kg/m         Assessment/Plan  Assessment/Plan   1. Renal cyst    2. Hematuria, unspecified type    3. UTI (urinary tract infection)      Orders Placed This Encounter   . URINE CULTURE   . US KIDNEY WITH BLADDER   . POCT Urine Dipstick        Plan  Microscopic hematuria-check microscopic UA today and also schedule renal U/S  Renal cyst- schedule renal U/S  UTI- urine culture and schedule renal U/S  Follow-up after U/S to review results.    John Williamsen, FNP-C

## 2022-04-06 LAB — URINALYSIS, MICROSCOPIC
HYALINE CASTS: 4 /lpf — ABNORMAL HIGH (ref <0)
RBCS: 2 /hpf (ref <4)
WBCS: 2 /hpf (ref <6)

## 2022-04-06 LAB — URINALYSIS, MACROSCOPIC
BILIRUBIN: NEGATIVE mg/dL
BLOOD: 1 mg/dL — AB
GLUCOSE: NEGATIVE mg/dL
KETONES: NEGATIVE mg/dL
LEUKOCYTES: NEGATIVE WBCs/uL
NITRITE: NEGATIVE
PH: 6 (ref 5.0–9.0)
PROTEIN: 300 mg/dL — AB
SPECIFIC GRAVITY: 1.024 (ref 1.002–1.030)
UROBILINOGEN: NORMAL mg/dL

## 2022-04-06 LAB — URINE CULTURE: URINE CULTURE: 10000 — AB

## 2022-05-29 ENCOUNTER — Other Ambulatory Visit (HOSPITAL_COMMUNITY): Payer: Self-pay | Admitting: Obstetrics & Gynecology

## 2022-05-29 DIAGNOSIS — Z1231 Encounter for screening mammogram for malignant neoplasm of breast: Secondary | ICD-10-CM

## 2022-06-12 ENCOUNTER — Other Ambulatory Visit: Payer: Self-pay

## 2022-06-12 ENCOUNTER — Encounter (HOSPITAL_COMMUNITY): Payer: Self-pay

## 2022-06-12 ENCOUNTER — Inpatient Hospital Stay
Admission: RE | Admit: 2022-06-12 | Discharge: 2022-06-12 | Disposition: A | Discharge status: Home / Self Care | Payer: BLUE CROSS/BLUE SHIELD | Source: Ambulatory Visit | Attending: Obstetrics & Gynecology | Admitting: Obstetrics & Gynecology

## 2022-06-12 DIAGNOSIS — Z1231 Encounter for screening mammogram for malignant neoplasm of breast: Secondary | ICD-10-CM | POA: Insufficient documentation

## 2022-06-15 ENCOUNTER — Other Ambulatory Visit: Payer: Self-pay

## 2022-06-15 ENCOUNTER — Inpatient Hospital Stay
Admission: RE | Admit: 2022-06-15 | Discharge: 2022-06-15 | Disposition: A | Discharge status: Home / Self Care | Payer: BLUE CROSS/BLUE SHIELD | Source: Ambulatory Visit | Attending: Family | Admitting: Family

## 2022-06-15 DIAGNOSIS — N281 Cyst of kidney, acquired: Secondary | ICD-10-CM | POA: Insufficient documentation

## 2022-06-15 DIAGNOSIS — R319 Hematuria, unspecified: Secondary | ICD-10-CM | POA: Insufficient documentation

## 2022-06-28 ENCOUNTER — Encounter (INDEPENDENT_AMBULATORY_CARE_PROVIDER_SITE_OTHER): Payer: Self-pay | Admitting: Student in an Organized Health Care Education/Training Program

## 2022-06-28 ENCOUNTER — Ambulatory Visit (INDEPENDENT_AMBULATORY_CARE_PROVIDER_SITE_OTHER): Payer: BLUE CROSS/BLUE SHIELD | Admitting: Student in an Organized Health Care Education/Training Program

## 2022-06-28 ENCOUNTER — Other Ambulatory Visit: Payer: Self-pay

## 2022-06-28 VITALS — BP 125/83 | HR 87 | Ht 60.0 in | Wt 138.0 lb

## 2022-06-28 DIAGNOSIS — N390 Urinary tract infection, site not specified: Secondary | ICD-10-CM

## 2022-06-28 DIAGNOSIS — R319 Hematuria, unspecified: Secondary | ICD-10-CM

## 2022-06-28 DIAGNOSIS — N281 Cyst of kidney, acquired: Secondary | ICD-10-CM

## 2022-06-28 NOTE — Progress Notes (Signed)
[facility]  UROLOGY, [facility]    Progress Note    Name: Kylar Speelman MRN:  [id number]   Date: 06/28/2022 Age: 41 y.o.       Chief Complaint: Follow Up (F/u on [place])    Subjective:   Ms. Sara Grant is a pleasant 41 year old female with a history of recurrent urinary tract infections and microscopic hematuria as well as a simple renal cyst.  She presents today for follow-up.  She denies any complaints.  She is not had a urinary tract infection in almost 6 months.  She states that the trigger for her urinary tract infections are when she holds her bladder for long periods of time due to stressors at work not allowing her 1st to use the bathroom as often as she needs to.  Now that she is no longer holding her urine for long stretches she has not had a recent urinary tract infection. She denies fevers, chills, nausea, vomiting, hematuria, dysuria, flank pain, incontinence, dribbling, hesitancy, suprapubic pain, headaches, vision changes, shortness of breath, chest pain.        Objective :  BP 125/83 (Site: Right, Patient Position: Sitting, Cuff Size: Adult)   Pulse 87   Ht 1.524 m (5')   Wt 62.6 kg (138 lb)   LMP 05/20/2022 (Exact Date)   BMI 26.95 kg/m       Gen: NAD, alert  Pulm: unlabored at rest  CV: palpable pulses  Abd: soft, Nt/ND  GU: no suprapubic tenderness, no CVAT    Data reviewed:    Current Outpatient Medications   Medication Sig    buPROPion (WELLBUTRIN XL) 150 mg extended release 24 hr tablet     cholecalciferol, vitamin D3, 250 mcg (10,000 unit) Oral Capsule Take 1 Capsule (10,000 Units total) by mouth Once a day    citalopram (CELEXA) 40 mg Oral Tablet Take 1 Tablet (40 mg total) by mouth Once a day    loratadine (CLARITIN) 10 mg Oral Tablet Take 1 Tablet (10 mg total) by mouth Once a day    LORazepam (ATIVAN) 0.5 mg Oral Tablet Take 1 Tablet (0.5 mg total) by mouth Twice per day as needed    losartan-hydrochlorothiazide (HYZAAR) 50-12.5 mg Oral Tablet     propranoloL (INDERAL) 40  mg Oral Tablet Take 1 Tablet (40 mg total) by mouth Once a day     Assessment/Plan  Problem List Items Addressed This Visit    None    Recurrent UTI  I discussed the epidemiology, pathogenesis, and prevention of recurrent acute uncomplicated cystitis and pyelonephritis of recurrent urinary tract infections with patient  We discussed that, while bothersome, there is currently no evidence linking recurrent infections to significant health problems, such as hypertension or renal disease, in the absence of anatomic or functional abnormalities of the urinary tract.  We spent the majority of our time discussing various prevention strategies, including:  Behavioral modifications:  If sexually active, avoidance of spermicides  While controlled studies have not shown post-coital voiding and liberal fluid intake to be associated with a reduced risk of recurrent UTI, these measures are unlikely to be harmful  Cranberry compounds:  While there is likely little harmful effect other than an increase in calorie and glucose intake with cranberry juice, there remains a limited proven role; however, consideration may be given to a well-studied cranberry pill supplements such as TheraCran HP (Theralogix) which has a standardized formula and has been shown in scientific studies to promote urinary tract health  Antimicrobial prophylaxis options (continuous, postcoital, or self-start therapy) were also discussed for women who experience two or more symptomatic UTIs within six months or three or more over 12 months  We discussed balancing the degree of discomfort experienced from these infections with real concerns about antimicrobial resistance  The prophylactic efficacy of any antimicrobial agent depends upon the continued susceptibility to the drug of potential uropathogens colonizing patient's fecal, periurethral, and vaginal flora and must be weighed against emerging uropathogen resistance patterns  Topical estrogen for  postmenopausal women as a means to increase the prevalence of lactobacilli and decrease in E. 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  We spent considerable time personally reviewing relevant imaging which are consistent with a simple renal cyst  I discussed the nature of incompletely characterized renal cyst warranting further dedicated imaging for further lesion characterization  I discussed the nature of simple appearing Bosniak I renal cyst which have been reported to affect more than 50% of patients over the age of 71 and warrant no further workup, surveillance or intervention if asymptomatic; however, repeat ultrasonography may be considered at 6 to 12 months in selected patients in order to confirm stability and a correct diagnosis  I discussed the nature of minimally complex Bosniak II renal cyst which pose limited oncologic potential and generally do not warrant further radiographic surveillance; however, repeat ultrasonography may be considered at 6 to 12 months in selected patients in order to confirm stability and a correct diagnosis  I discussed the nature of complex Bosniak IIF renal cyst which has a reported incidence of malignancy ranging from 5-20% and accordingly warrants further radiographic surveillance  I discussed the nature of complex Bosniak III renal cyst which carries nearly a 50% risk of malignancy and warrants surgical  extirpation  I discussed the nature of complex Bosniak IV renal cyst which carries nearly a 90% risk of malignancy and warrants surgical extirpation  Based on patient's clinical and radiographic findings, I would recommend no further imaging at this time  Generally the indications for surgical intervention for renal cysts include suspected malignancy, pain attributable to cyst expansion, cyst infection and angiotensin-dependent hypertension.  We also discussed the limited role of percutaneous renal fine-needle aspiration (FNA) biopsy; unfortunately, biopsy cannot reliably distinguish a benign renal cyst from the less common cystic renal cell carcinoma.  Preoperative needle biopsy has generally not been recommended for resectable renal lesions, because of concern about seeding of the peritoneum.      History of microscopic hematuria, low risk  I discussed the differential diagnosis, pathophysiology and nature of asymptomatic microscopic hematuria with patient.  We discussed the that patient falls into the low risk category for genitourinary malignancy as defined by the 2020 AUA Guidelines on microscopic hematuria and the need for further evaluation including:  Labs:  UA, Renal US, MRI  Evaluation: no cystoscopy at this time  Risks of cystourethroscopy including infection, discomfort, transient hematuria, as well as benefits and alternatives were discussed, if pertinent, with  patient in detail who voiced agreement and understanding        Arlana Hove, DO     A combined total of 45 minutes were spent preparing to see the patient, reviewing previous records, ordering tests/medications/procedures, documenting the clinical encounter as well as performing a medically appropriate evaluation and independently interpreting results and communicating them to the patient/family/caregiver as specifically outlined above in the impression and plan.

## 2022-09-06 ENCOUNTER — Other Ambulatory Visit: Payer: BLUE CROSS/BLUE SHIELD | Attending: Emergency Medicine

## 2022-09-06 ENCOUNTER — Other Ambulatory Visit: Payer: Self-pay

## 2022-09-06 DIAGNOSIS — U071 COVID-19: Secondary | ICD-10-CM

## 2022-09-06 LAB — COVID-19, FLU A/B, RSV RAPID BY PCR - LAB USE ONLY
INFLUENZA VIRUS TYPE A: NOT DETECTED
INFLUENZA VIRUS TYPE B: NOT DETECTED
RESPIRATORY SYNCTIAL VIRUS (RSV): NOT DETECTED
SARS-CoV-2: DETECTED — AB

## 2022-12-26 ENCOUNTER — Other Ambulatory Visit: Payer: Self-pay

## 2022-12-26 ENCOUNTER — Ambulatory Visit (INDEPENDENT_AMBULATORY_CARE_PROVIDER_SITE_OTHER): Payer: BC Managed Care – PPO | Admitting: Physician Assistant

## 2022-12-26 VITALS — BP 104/69 | HR 82 | Ht 60.0 in | Wt 136.0 lb

## 2022-12-26 DIAGNOSIS — Z8742 Personal history of other diseases of the female genital tract: Secondary | ICD-10-CM

## 2022-12-26 DIAGNOSIS — N390 Urinary tract infection, site not specified: Secondary | ICD-10-CM

## 2022-12-26 DIAGNOSIS — N281 Cyst of kidney, acquired: Secondary | ICD-10-CM

## 2022-12-26 DIAGNOSIS — R319 Hematuria, unspecified: Secondary | ICD-10-CM

## 2022-12-26 NOTE — Progress Notes (Signed)
[facility]    Progress Note    Name: Sara Grant MRN:  [id number]   Date: 12/26/2022 Age: 42 y.o.       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 Sara Grant is a pleasant 42 year old female with a history of recurrent urinary tract infections and microscopic hematuria as well as a simple renal cyst.   Patient with suspected gross hematuria last month, shortly before menstruating. She states that the trigger for her urinary tract infections are when she holds her bladder for long periods of time due to stressors at work not allowing her to use the bathroom as often as she needs to.  Now that she is no longer holding her urine for long stretches. Patient reports 2 episodes of UTI since last visit. She denies fevers, chills, nausea, vomiting, hematuria, dysuria, flank pain, incontinence, dribbling, hesitancy, suprapubic pain, headaches, vision changes, shortness of breath, chest pain. Patient continues to take Cranberry supplements.        Objective :  BP 104/69 (Site: Right, Patient Position: Sitting, Cuff Size: Adult)   Pulse 82   Ht 1.524 m (5')   Wt 61.7 kg (136 lb)   BMI 26.56 kg/m       Gen: NAD, alert  Pulm: unlabored at rest  CV: palpable pulses  Abd: soft, Nt/ND  GU: no suprapubic tenderness, no CVAT    Data reviewed:    Current Outpatient Medications   Medication Sig    buPROPion (WELLBUTRIN XL) 150 mg extended release 24 hr tablet     cholecalciferol, vitamin D3, 250 mcg (10,000 unit) Oral Capsule Take 1 Capsule (10,000 Units total) by mouth Once a day    citalopram (CELEXA) 40 mg Oral Tablet Take 1 Tablet (40 mg total) by mouth Once a day    loratadine (CLARITIN) 10 mg Oral Tablet Take 1 Tablet (10 mg total) by mouth Once a day    LORazepam (ATIVAN) 0.5 mg Oral Tablet Take 1 Tablet (0.5 mg total) by mouth Twice per day as needed     losartan-hydrochlorothiazide (HYZAAR) 50-12.5 mg Oral Tablet     propranoloL (INDERAL) 40 mg Oral Tablet Take 1 Tablet (40 mg total) by mouth Once a day     Assessment/Plan  Problem List Items Addressed This Visit    None    Recurrent UTI  I discussed the epidemiology, pathogenesis, and prevention of recurrent acute uncomplicated cystitis and pyelonephritis of recurrent urinary tract infections with patient  We discussed that, while bothersome, there is currently no evidence linking recurrent infections to significant health problems, such as hypertension or renal disease, in the absence of anatomic or functional abnormalities of the urinary tract.  We spent the majority of our time discussing various prevention strategies, including:  Behavioral modifications:  If sexually active, avoidance of spermicides  While controlled studies have not shown post-coital voiding and liberal fluid intake to be associated with a reduced risk of recurrent UTI, these measures are unlikely to be harmful  Cranberry compounds:  While there is likely little harmful effect other than an increase in calorie and glucose intake with cranberry juice, there remains a limited proven role; however, consideration may be given to a well-studied cranberry pill supplements such as TheraCran HP (Theralogix) which has a standardized formula and has been shown in scientific studies to promote urinary  tract health  Antimicrobial prophylaxis options (continuous, postcoital, or self-start therapy) were also discussed for women who experience two or more symptomatic UTIs within six months or three or more over 12 months  We discussed balancing the degree of discomfort experienced from these infections with real concerns about antimicrobial resistance  The prophylactic efficacy of any antimicrobial agent depends upon the continued susceptibility to the drug of potential uropathogens colonizing patient's fecal, periurethral, and vaginal flora and must be  weighed against emerging uropathogen resistance patterns  Topical estrogen for postmenopausal women as a means to increase the prevalence of lactobacilli and decrease in E. 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  We spent considerable time personally reviewing relevant imaging which are consistent with a simple renal cyst  I discussed the nature of incompletely characterized renal cyst warranting further dedicated imaging for further lesion characterization  I discussed the nature of simple appearing Bosniak I renal cyst which have been reported to affect more than 50% of patients over the age of 20 and warrant no further workup, surveillance or intervention if asymptomatic; however, repeat ultrasonography may be considered at 6 to 12 months in selected patients in order to confirm stability and a correct diagnosis  I discussed the nature of minimally complex Bosniak II renal cyst which pose limited oncologic potential and generally do not warrant further radiographic surveillance; however, repeat ultrasonography may be considered at 6 to 12 months in selected patients in order to confirm stability and a correct diagnosis  I discussed the nature of complex Bosniak IIF renal cyst which has a reported incidence of malignancy ranging from 5-20% and accordingly warrants further radiographic  surveillance  I discussed the nature of complex Bosniak III renal cyst which carries nearly a 50% risk of malignancy and warrants surgical extirpation  I discussed the nature of complex Bosniak IV renal cyst which carries nearly a 90% risk of malignancy and warrants surgical extirpation  Based on patient's clinical and radiographic findings, I would recommend no further imaging at this time  Generally the indications for surgical intervention for renal cysts include suspected malignancy, pain attributable to cyst expansion, cyst infection and angiotensin-dependent hypertension.  We also discussed the limited role of percutaneous renal fine-needle aspiration (FNA) biopsy; unfortunately, biopsy cannot reliably distinguish a benign renal cyst from the less common cystic renal cell carcinoma.  Preoperative needle biopsy has generally not been recommended for resectable renal lesions, because of concern about seeding of the peritoneum.      History of microscopic hematuria, low risk  I discussed the differential diagnosis, pathophysiology and nature of asymptomatic microscopic hematuria with patient.  We discussed the that patient falls into the low risk category for genitourinary malignancy as defined by the 2020 AUA Guidelines on microscopic hematuria and the need for further evaluation including:  Renal US reviewed, MRI reviewed  Evaluation: Ua, microscopic, cystoscopy  Risks of cystourethroscopy including infection, discomfort, transient hematuria, as well as  benefits and alternatives were discussed, if pertinent, with patient in detail who voiced agreement and understanding

## 2023-01-14 ENCOUNTER — Encounter (HOSPITAL_COMMUNITY)
Admission: RE | Disposition: A | Discharge status: Home / Self Care | Payer: Self-pay | Source: Ambulatory Visit | Attending: Student in an Organized Health Care Education/Training Program

## 2023-01-14 ENCOUNTER — Inpatient Hospital Stay
Admission: RE | Admit: 2023-01-14 | Discharge: 2023-01-14 | Disposition: A | Discharge status: Home / Self Care | Payer: BC Managed Care – PPO | Source: Ambulatory Visit | Attending: Student in an Organized Health Care Education/Training Program | Admitting: Student in an Organized Health Care Education/Training Program

## 2023-01-14 ENCOUNTER — Encounter (HOSPITAL_COMMUNITY): Payer: Self-pay | Admitting: Student in an Organized Health Care Education/Training Program

## 2023-01-14 ENCOUNTER — Other Ambulatory Visit: Payer: Self-pay

## 2023-01-14 DIAGNOSIS — R319 Hematuria, unspecified: Secondary | ICD-10-CM

## 2023-01-14 SURGERY — CYSTOSCOPY
Anesthesia: None (Nurse-Monitored) | Wound class: Clean Contaminated Wounds-The respiratory, GI, Genital, or urinary

## 2023-01-14 SURGICAL SUPPLY — 3 items
CYSTOSCOPE FLEXIBLE ASCOPE 15.4IN X 2.2MM 16.2FR REVERSE DEFLECTION LEVER (SURGICAL INSTRUMENTS) ×1 IMPLANT
DRAPE FNFLD ABS REINF 77X53IN 43528 PRXM LF  STRL DISP SURG SMS 44X23IN (DRAPE/PACKS/SHEETS/OR TOWEL) ×1 IMPLANT
DRAPE REINF FNFLD 90X44IN LF  STRL DISP SURG (DRAPE/PACKS/SHEETS/OR TOWEL) ×1 IMPLANT

## 2023-01-14 NOTE — Discharge Instructions (Addendum)
SURGICAL DISCHARGE INSTRUCTIONS     Dr. Arlana Hove, DO  performed your FLEXILBE CYSTOSCOPY today at the [facility]    [facility]:  Monday through Friday from 8 a.m. - 4 p.m.: [phone]    For T&D: [phone]  Between 4 p.m. - 8 a.m., weekends and holidays:  Call ER [phone]    PLEASE SEE WRITTEN HANDOUTS AS DISCUSSED BY YOUR NURSE:  ***    SIGNS AND SYMPTOMS OF A WOUND / INCISION INFECTION   Be sure to watch for the following:  Increase in redness or red streaks near or around the wound or incision.  Increase in pain that is intense or severe and cannot be relieved by the pain medication that your doctor has given you.  Increase in swelling that cannot be relieved by elevation of a body part, or by applying ice, if permitted.  Increase in drainage, or if yellow / green in color and smells bad. This could be on a dressing or a cast.  Increase in fever for longer than 24 hours, or an increase that is higher than 101 degrees Fahrenheit (normal body temperature is 98 degrees Fahrenheit). The incision may feel warm to the touch.    **CALL YOUR DOCTOR IF ONE OR MORE OF THESE SIGNS / SYMPTOMS SHOULD OCCUR.    ANESTHESIA INFORMATION   LOCAL ANESTHETIC:  You have receieved a local anesthetic, the effects should disappear in a few hours.    REMEMBER   If you experience any difficulty breathing, chest pain, bleeding that you feel is excessive, persistent nausea or vomiting or for any other concerns:  Call your physician Dr.  Arlana Hove, DO   at [phone] . You may also ask to have the general doctor on call paged. They are available to you 24 hours a day.      SPECIAL INSTRUCTIONS / COMMENTS   REST TODAY--DRINK PLENTY OF FLUIDS  DR CUTRONE'S OFFICE WILL CALL PATIENT WITH RETURN APPOINTMENT        Dr Arlana Hove [phone]

## 2023-01-14 NOTE — OR Surgeon (Signed)
[facility]                                              OPERATIVE NOTE    Patient Name: Sara Grant, Paske Number: [id number]  Date of Service: 01/14/2023   Date of Birth: Aug 28, 1981    All elements must be documented.    Pre-Operative Diagnosis:hematuria   Post-Operative Diagnosis:same  Procedure(s)/Description:flexible cystoscopy  Findings: normal appearing bladder urothelium, slightly narrowed (without pathologic stricture) of urethra    Patient prepped and draped in sterile fashion.  The patient's urethra was slightly retracted in the introitus and mildly narrowed wishes required some manipulation to place the flexible cystoscope.  Following this systematic evaluation of the bladder did not reveal any tumors polyps diverticula or trabeculations.  Bilateral ureteral orifices in orthotopic position.  There was no evidence of any tumors or abnormal urothelium      Attending Surgeon: Arlana Hove  Assistant(s): NA    Anesthesia Type: None (Nurse-Monitored)  Estimated Blood Loss:  Minimal  Blood Given: NA  Fluids Given: NS  Complications (unintended/unexpected/iatrogenic/accidental/inadvertent events):  NA  Characteristic Event (routinely expected or inherent to the difficulty/nature of the procedure): NA  Did the use of current and/or prior Anticoagulants impact the outcome of the case? No  Wound Class: Clean Contaminated Wounds -Respiratory, GI, Genital, or Urinary    Tubes: None  Drains: None  Specimens/ Cultures: NA  Implants: NA           Disposition: PACU - hemodynamically stable.  Condition: stable    Plan:  Follow up in 6 months      Arlana Hove, DO

## 2023-01-14 NOTE — H&P (Signed)
[facility]  Urology Admission History and Physical      Sara Grant, Sara Grant, 42 y.o. female  Encounter Start Date:  (Not on file)  Inpatient Admission Date:    Date of Birth:  May 13, 1981    PCP: Sara FlemingsShawn Vest, FNP-BC    Information Obtained from: patient  Chief Complaint: hematuria       HPI:    Ms. Sara Grant is a pleasant 42 year old female with a history of recurrent urinary tract infections and microscopic hematuria as well as a simple renal cyst.   Patient with suspected gross hematuria last month, shortly before menstruating. She states that the trigger for her urinary tract infections are when she holds her bladder for long periods of time due to stressors at work not allowing her to use the bathroom as often as she needs to.  Now that she is no longer holding her urine for long stretches. Patient reports 2 episodes of UTI since last visit. She denies fevers, chills, nausea, vomiting, hematuria, dysuria, flank pain, incontinence, dribbling, hesitancy, suprapubic pain, headaches, vision changes, shortness of breath, chest pain. Patient continues to take Cranberry supplements.        ROS:  MUST comment on all "Abnormal" findings   ROS Other than ROS in the HPI, all other systems were negative.      PAST MEDICAL/ FAMILY/ SOCIAL HISTORY:       Past Medical History:   Diagnosis Date    Anxiety     Chronic urinary tract infection     Depression     Hypertension     Microscopic hematuria     OAB (overactive bladder)          No Known Allergies   Cannot display prior to admission medications because the patient has not been admitted in this contact.           No current facility-administered medications for this encounter.    Past Surgical History:   Procedure Laterality Date    HX CESAREAN SECTION      x2         Social History     Tobacco Use    Smoking status: Never    Smokeless tobacco: Never   Vaping Use    Vaping status: Never Used   Substance Use Topics    Alcohol use: Yes     Comment: Occasional     Drug use: Never       Gen: NAD, alert  Pulm: unlabored at rest  CV: palpable pulses  Abd: soft, Nt/ND  GU: no suprapubic tenderness, no CVAT      Assessment & Plan:   There are no active hospital problems to display for this patient.    Recurrent UTI  I discussed the epidemiology, pathogenesis, and prevention of recurrent acute uncomplicated cystitis and pyelonephritis of recurrent urinary tract infections with patient  We discussed that, while bothersome, there is currently no evidence linking recurrent infections to significant health problems, such as hypertension or renal disease, in the absence of anatomic or functional abnormalities of the urinary tract.  We spent the majority of our time discussing various prevention strategies, including:  Behavioral modifications:  If sexually active, avoidance of spermicides  While controlled studies have not shown post-coital voiding and liberal fluid intake to be associated with a reduced risk of recurrent UTI, these measures are unlikely to be harmful  Cranberry compounds:  While there is likely little harmful effect other than an increase in calorie and glucose intake  with cranberry juice, there remains a limited proven role; however, consideration may be given to a well-studied cranberry pill supplements such as TheraCran HP (Theralogix) which has a standardized formula and has been shown in scientific studies to promote urinary tract health  Antimicrobial prophylaxis options (continuous, postcoital, or self-start therapy) were also discussed for women who experience two or more symptomatic UTIs within six months or three or more over 12 months  We discussed balancing the degree of discomfort experienced from these infections with real concerns about antimicrobial resistance  The prophylactic efficacy of any antimicrobial agent depends upon the continued susceptibility to the drug of potential uropathogens colonizing patient's fecal, periurethral, and vaginal flora  and must be weighed against emerging uropathogen resistance patterns  Topical estrogen for postmenopausal women as a means to increase the prevalence of lactobacilli and decrease in E. 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  We spent considerable time personally reviewing relevant imaging which are consistent with a simple renal cyst  I discussed the nature of incompletely characterized renal cyst warranting further dedicated imaging for further lesion characterization  I discussed the nature of simple appearing Bosniak I renal cyst which have been reported to affect more than 50% of patients over the age of 67 and warrant no further workup, surveillance or intervention if asymptomatic; however, repeat ultrasonography may be considered at 6 to 12 months in selected patients in order to confirm stability and a correct diagnosis  I discussed the nature of minimally complex Bosniak II renal cyst which pose limited oncologic potential and generally do not warrant further radiographic surveillance; however, repeat ultrasonography may be considered at 6 to 12 months in selected patients in order to confirm stability and a correct diagnosis  I discussed the nature of complex Bosniak IIF renal cyst which has a reported incidence of malignancy ranging from 5-20% and accordingly warrants further radiographic  surveillance  I discussed the nature of complex Bosniak III renal cyst which carries nearly a 50% risk of malignancy and warrants surgical extirpation  I discussed the nature of complex Bosniak IV renal cyst which carries nearly a 90% risk of malignancy and warrants surgical extirpation  Based on patient's clinical and radiographic findings, I would recommend no further imaging at this time  Generally the indications for surgical intervention for renal cysts include suspected malignancy, pain attributable to cyst expansion, cyst infection and angiotensin-dependent hypertension.  We also discussed the limited role of percutaneous renal fine-needle aspiration (FNA) biopsy; unfortunately, biopsy cannot reliably distinguish a benign renal cyst from the less common cystic renal cell carcinoma.  Preoperative needle biopsy has generally not been recommended for resectable renal lesions, because of concern about seeding of the peritoneum.        History of microscopic hematuria, low risk  I discussed the differential diagnosis, pathophysiology and nature of asymptomatic microscopic hematuria with patient.  We discussed the that patient falls into the low risk  category for genitourinary malignancy as defined by the 2020 AUA Guidelines on microscopic hematuria and the need for further evaluation including:  Renal US reviewed, MRI reviewed  Evaluation: Ua, microscopic, cystoscopy  Risks of cystourethroscopy including infection, discomfort, transient hematuria, as well as benefits and alternatives were discussed, if pertinent, with patient in detail who voiced agreement and understanding        Arlana Hove, DO

## 2023-01-24 IMAGING — CT CT ABDOMEN & PELVIS WITH CONTRAST
2 of 4 series · 16 of 46 positions shown, 18 images · IV contrast (CONTRAST)
Comparison: MRI abdomen dated 02/15/2022.

﻿EXAM:  CT ABDOMEN & PELVIS WITH CONTRAST
INDICATION: 42-year-old female with a history of pancreatic cyst at the junction of head and body of the pancreas.  History of right renal cyst.
TECHNIQUE: CT scan was performed after oral contrast and administration of 75 cc Ultravist 300 including delayed postcontrast study. Images were reviewed in multiple windows and projections. Exam was performed using 1 or more of the following dose reduction techniques: Automated exposure control, adjustment of the mA and/or kV according to patient size, or the use of iterative reconstruction technique.

[axial · axial · 0.78mm/px · z∈[-624,-291]mm · 13 of 129 slices shown, 15 images]
[im 9/129  soft-tissue]
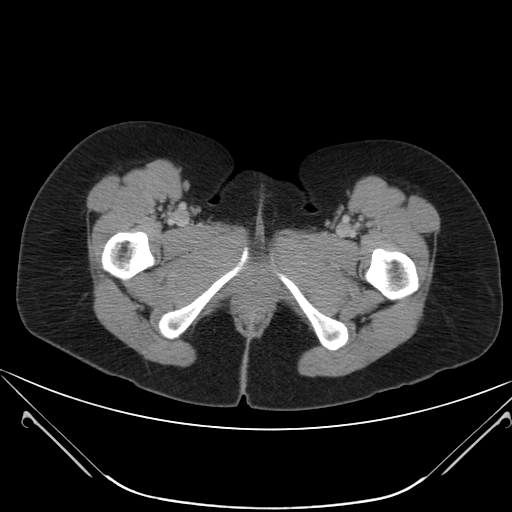
[im 9/129  bone]
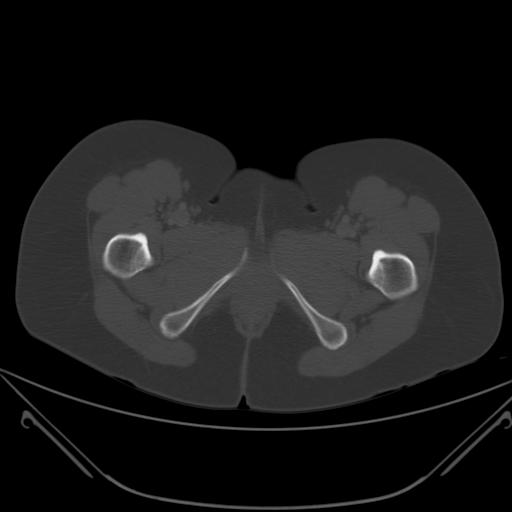
[im 18/129  soft-tissue]
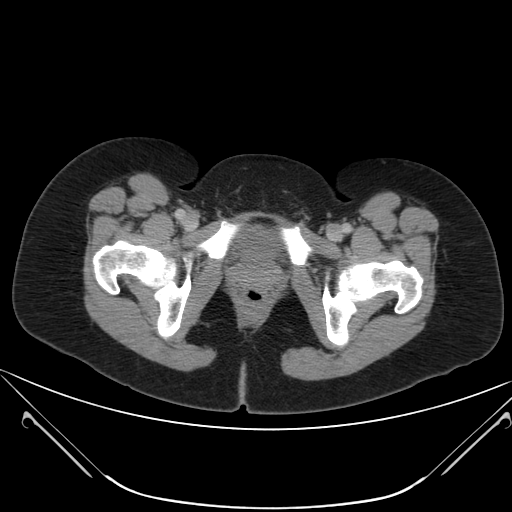
[im 26/129  soft-tissue]
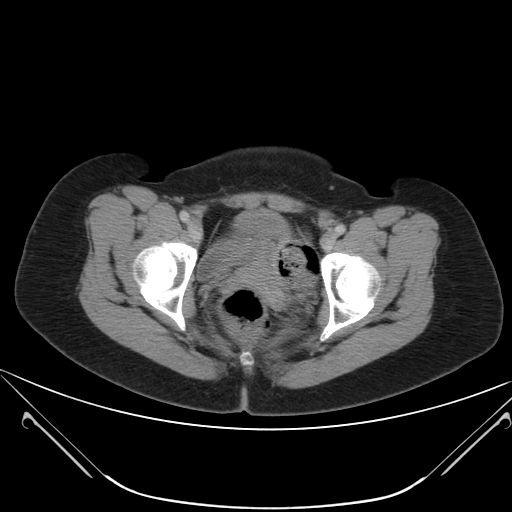
[im 35/129  soft-tissue]
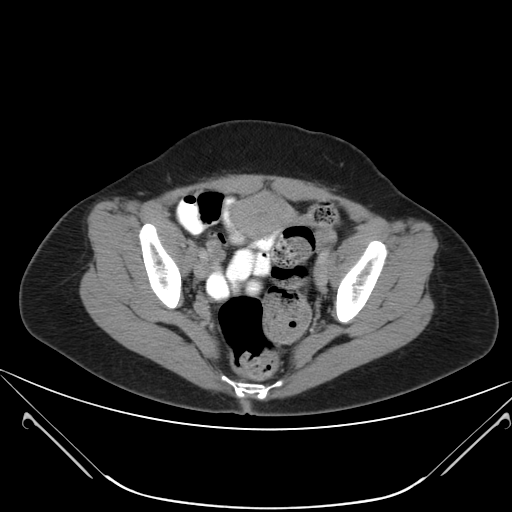
[im 43/129  soft-tissue]
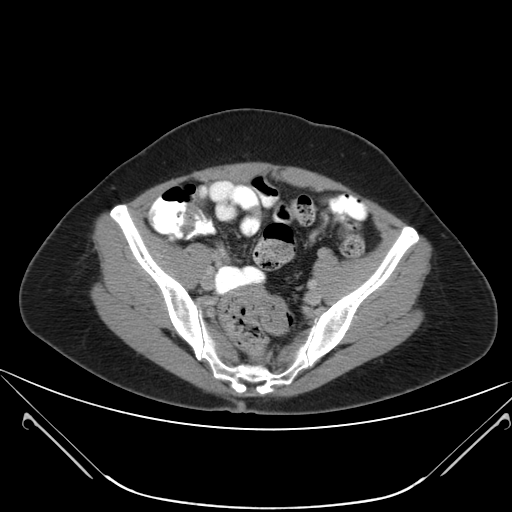
[im 52/129  soft-tissue]
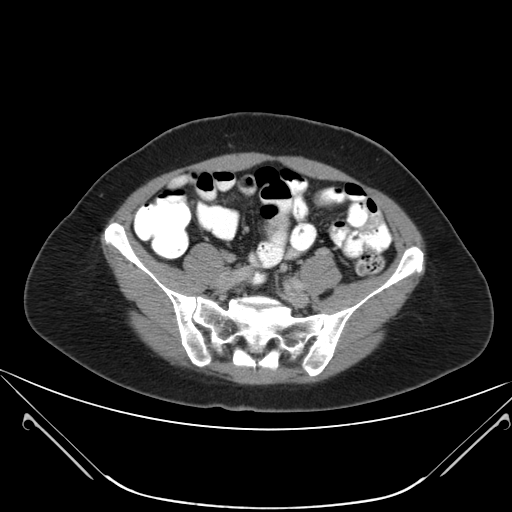
[im 69/129  soft-tissue]
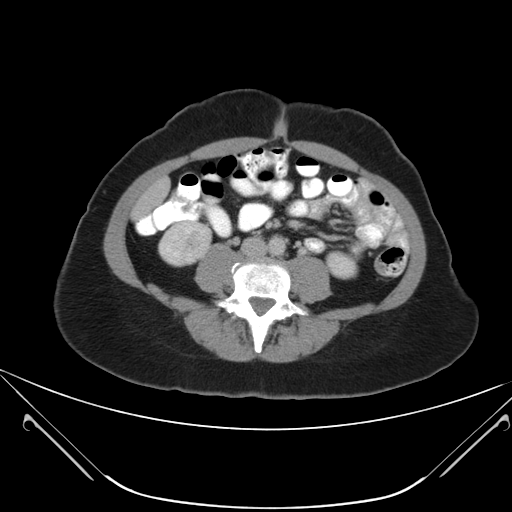
[im 77/129  soft-tissue]
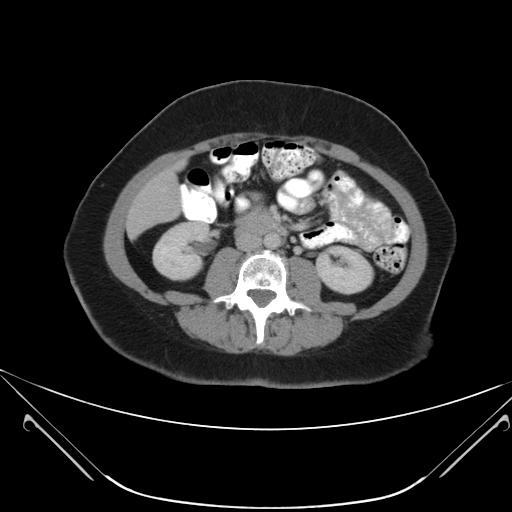
[im 86/129  soft-tissue]
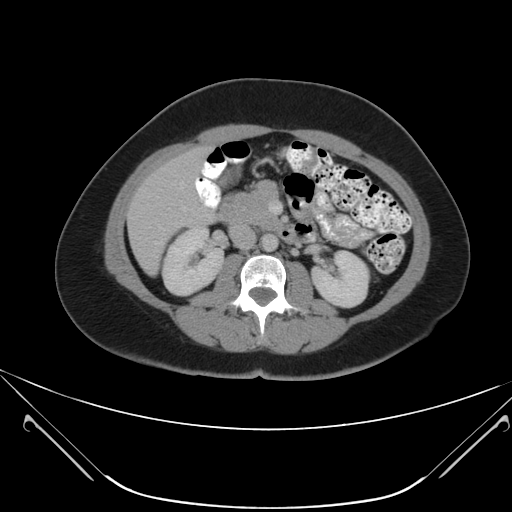
[im 86/129  bone]
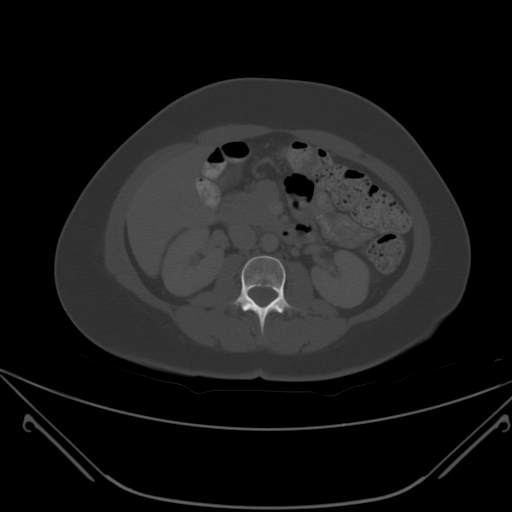
[im 94/129  soft-tissue]
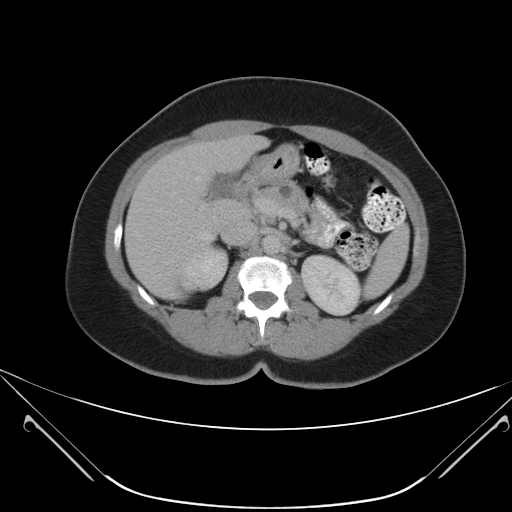
[im 103/129  soft-tissue]
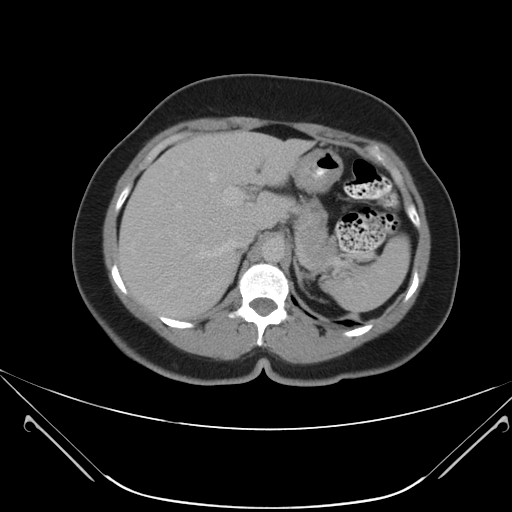
[im 111/129  soft-tissue]
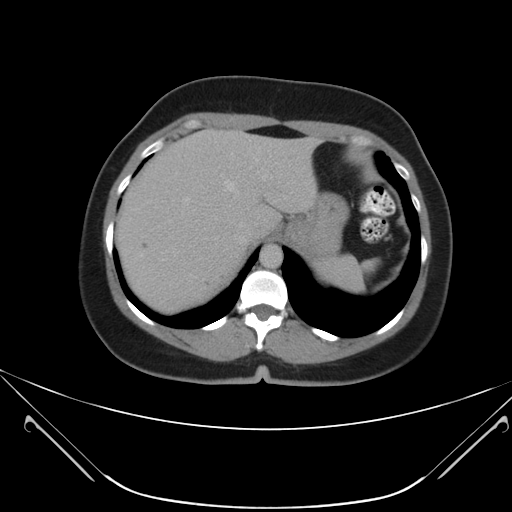
[im 120/129  soft-tissue]
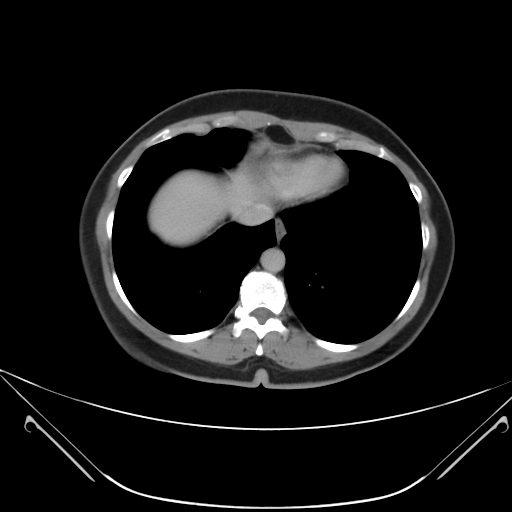

[cor · coronal · 0.78mm/px · 3 of 59 slices shown]
[im 20/59  soft-tissue]
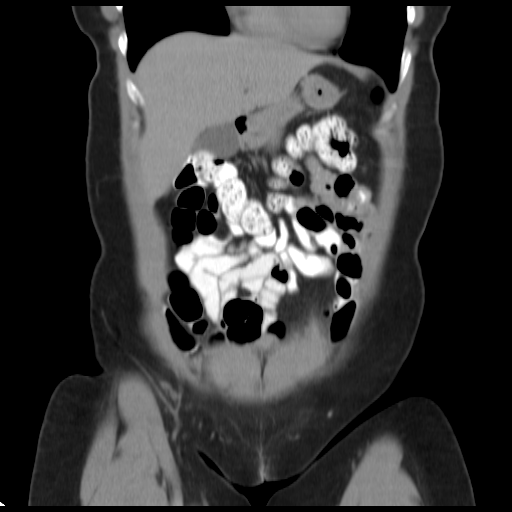
[im 26/59  soft-tissue]
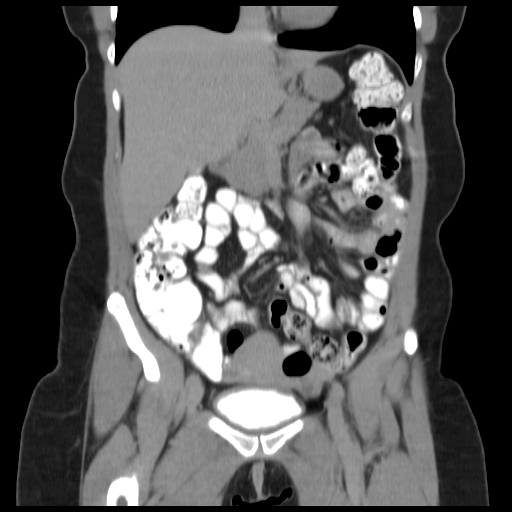
[im 33/59  soft-tissue]
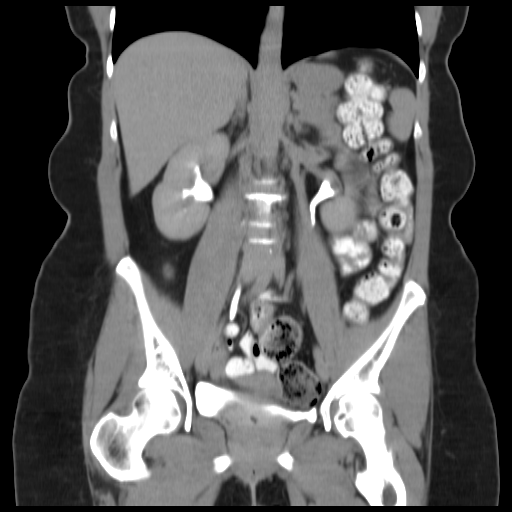

[16 of 46 positions shown; findings below may reference images not displayed]

Report of outside CT abdomen dated 01/03/2022.  Previous CT abdomen dated 12/27/2016.
FINDINGS: No focal lesions of liver other than tiny cystic lesions of the liver involving the right lobe of the liver.  The cysts are in the 2-3 mm size range.

Gallbladder and bile ducts are normal. Stable cyst of the pancreas at the junction of body and head of the pancreas 1.5 cm in diameter.  There is no evidence of pancreatic duct dilation or changes of chronic pancreatitis.

Stable cyst of the right kidney 2.5 cm in diameter.  There are other tiny 2-3 mm cysts noted in both kidneys.

No retroperitoneal adenopathy or bowel obstruction is seen.  No pelvic mass or fluid collections are seen.  Degenerative disc disease at L4-5 level.
IMPRESSION: 1. Stable cystic lesion at the junction of body and head of the pancreas, 1.5 cm in diameter unchanged in appearance from 02/15/2022 and 01/03/2022 examinations.

2. Tiny cystic lesions of the liver as well as stable 2.5 cm cystic lesion of right kidney as well as multiple tiny cystic lesions of both kidneys.  The combination of these findings suggests possibility of underlying polycystic renal disease with evolving cystic changes in the liver and kidney.  Detailed  family history is recommended.  Referral for genetic testing is suggested.

Electronically Signed by LINKS, IFEOLUWAPO at 18-Cpr-TETJ [DATE]

## 2023-02-19 ENCOUNTER — Other Ambulatory Visit (INDEPENDENT_AMBULATORY_CARE_PROVIDER_SITE_OTHER): Payer: Self-pay

## 2023-02-26 ENCOUNTER — Encounter (INDEPENDENT_AMBULATORY_CARE_PROVIDER_SITE_OTHER): Payer: Self-pay | Admitting: Nephrology

## 2023-05-06 ENCOUNTER — Telehealth (INDEPENDENT_AMBULATORY_CARE_PROVIDER_SITE_OTHER): Payer: Self-pay | Admitting: Student in an Organized Health Care Education/Training Program

## 2023-05-06 DIAGNOSIS — R300 Dysuria: Secondary | ICD-10-CM

## 2023-05-06 NOTE — Nursing Note (Signed)
Pt called requesting a urine culture.  States she is having s&s of a UTI.  Order placed. States she will stop by tomorrow to give a specimen.

## 2023-05-16 ENCOUNTER — Other Ambulatory Visit (HOSPITAL_COMMUNITY): Payer: Self-pay

## 2023-05-16 IMAGING — MR MRI CERVICAL SPINE WITHOUT CONTRAST
4 of 5 series · 23 of 48 positions shown · IV contrast (gadolinium)
Comparison: C-spine x-ray dated 05/03/2023.

﻿EXAM:  [id number]   MRI CERVICAL SPINE WITHOUT CONTRAST
INDICATION: 42-year-old sustained trauma at work lifting injury.  Right hand numbness and weakness.  Follow-up of radiographic finding of degenerative changes with osteophyte complex.  Upper extremity weakness.
TECHNIQUE: Multiplanar, multisequential MRI of the C-spine was performed without gadolinium contrast.

[Series 5: T2 · sagittal · 3.0mm · 0.75mm/px · 8 of 13 slices shown (1 of 2)]
[im 1/13]
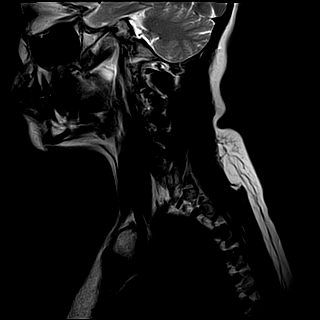
[im 2/13]
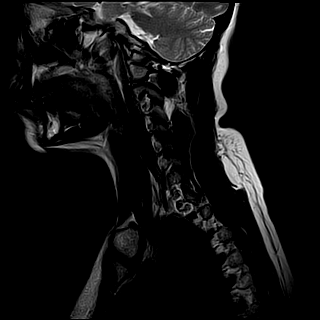
[im 4/13]
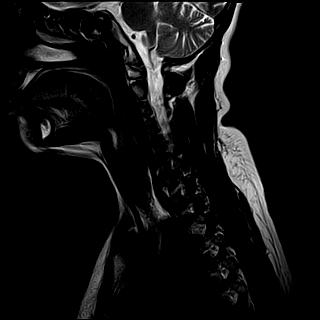
[im 6/13]
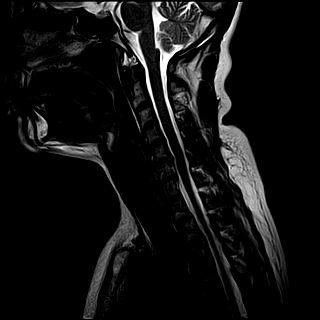
[im 7/13]
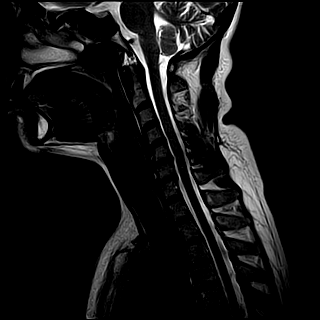
[im 9/13]
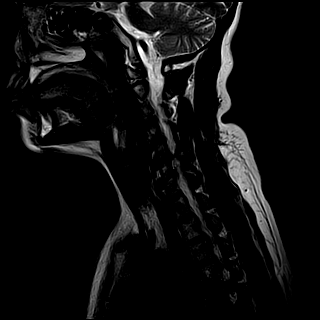
[im 11/13]
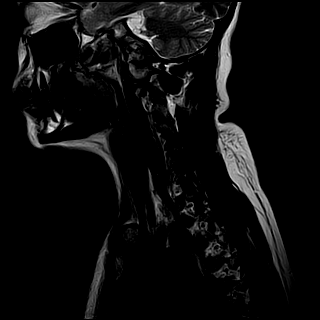
[im 13/13]
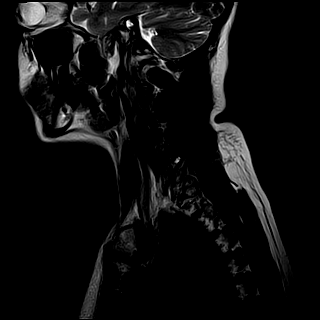

[Series 6: T1 · sagittal · 3.0mm · 0.47mm/px · 3 of 13 slices shown]
[im 2/13]
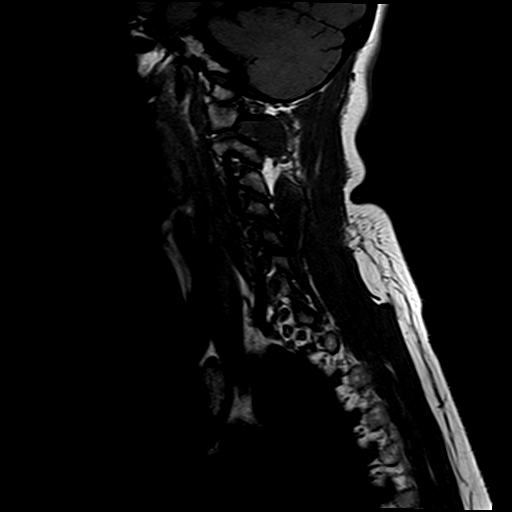
[im 7/13]
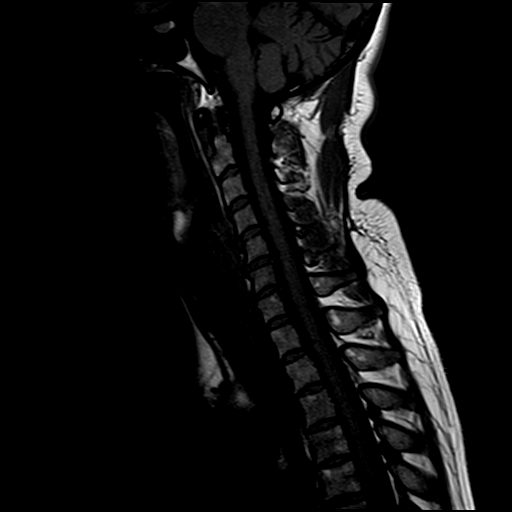
[im 11/13]
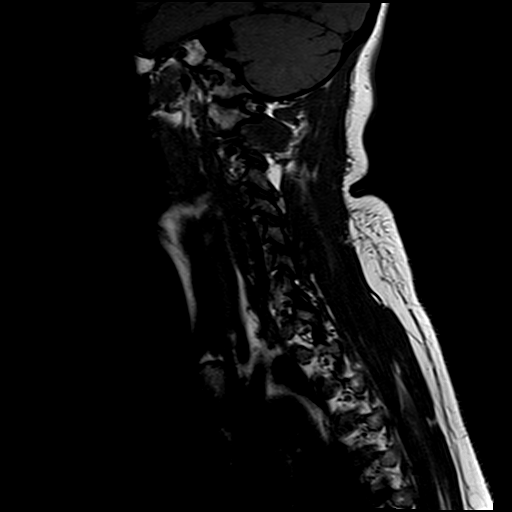

[Series 7: STIR · sagittal · 3.0mm · 0.47mm/px · 3 of 13 slices shown]
[im 2/13]
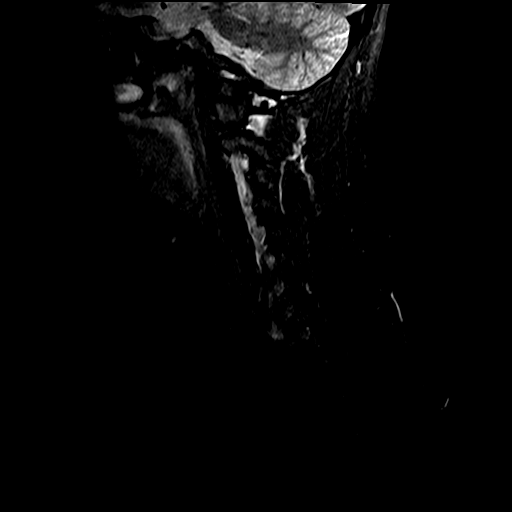
[im 7/13]
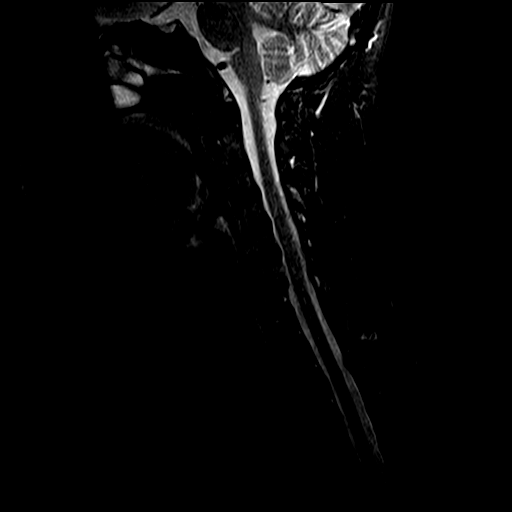
[im 11/13]
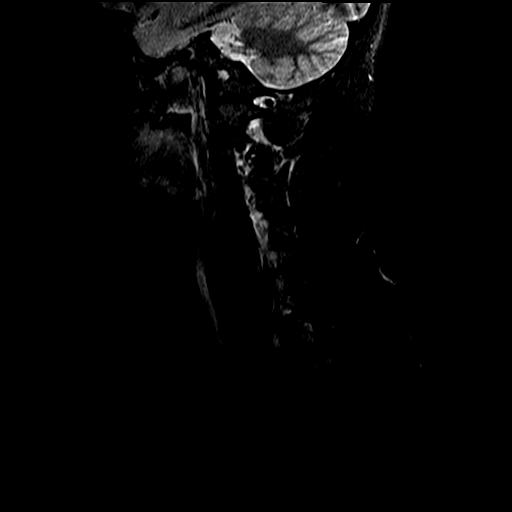

[Series 8: T2 · axial · 3.0mm · 0.39mm/px · z∈[-68,+15]mm · 9 of 18 slices shown (2 of 2)]
[im 1/18]
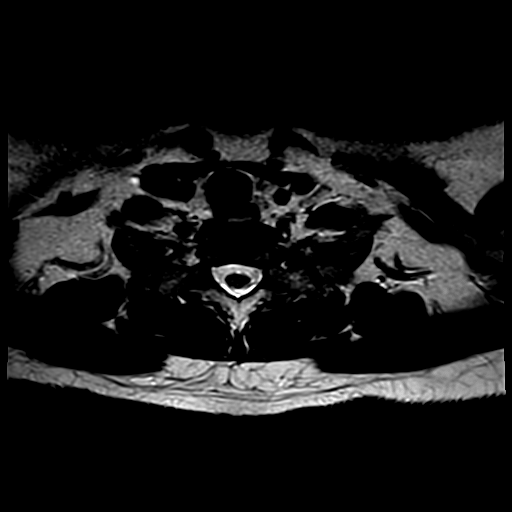
[im 4/18]
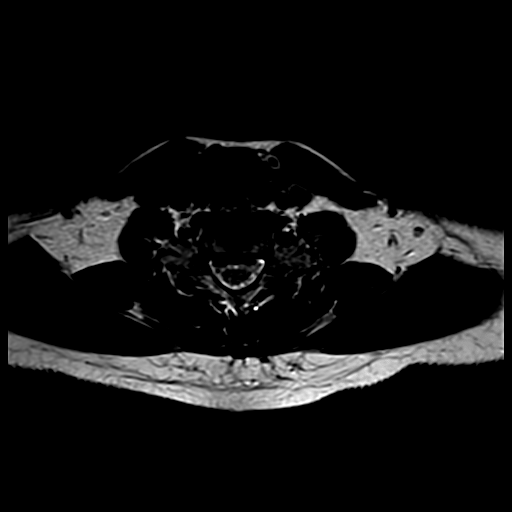
[im 5/18]
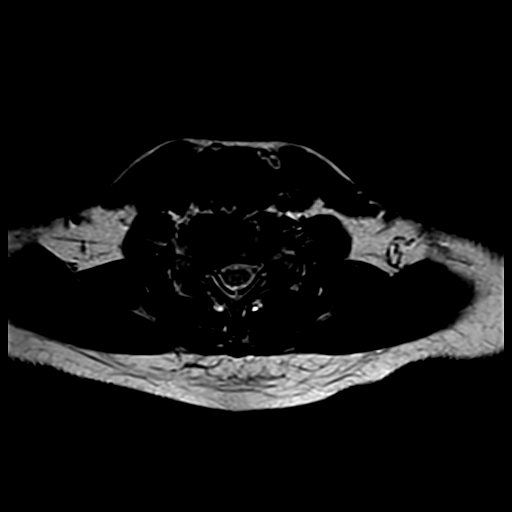
[im 8/18]
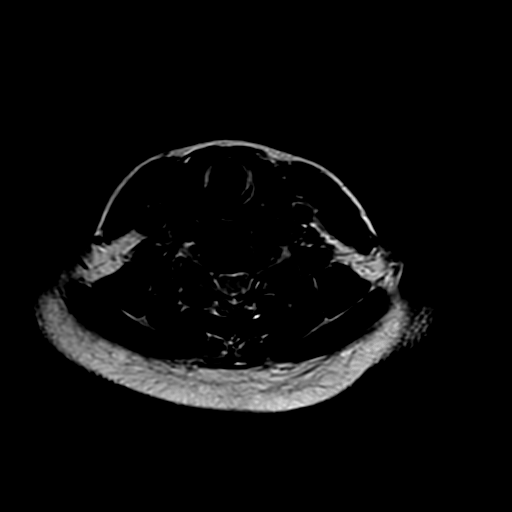
[im 10/18]
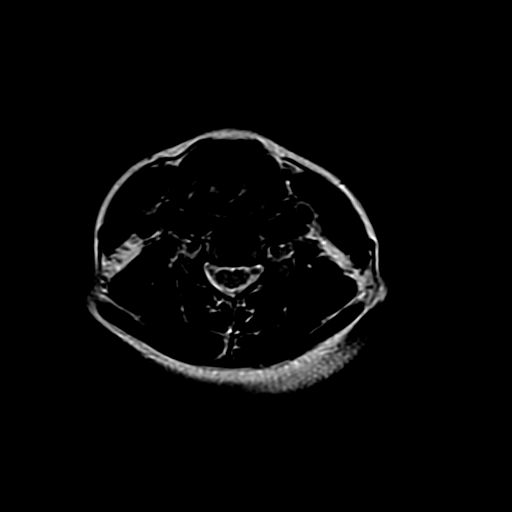
[im 13/18]
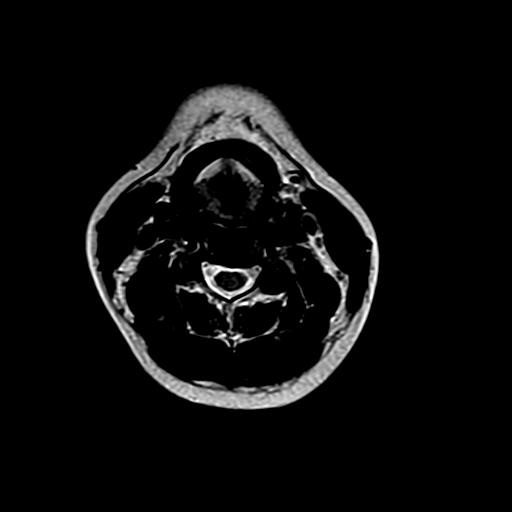
[im 14/18]
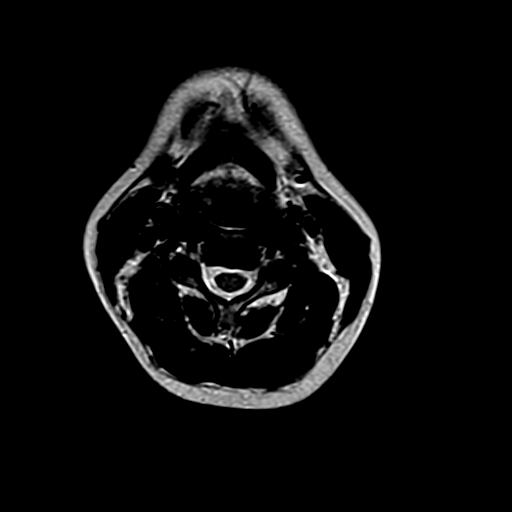
[im 16/18]
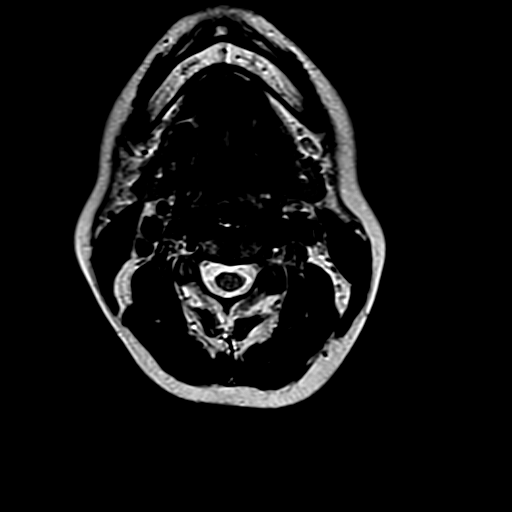
[im 18/18]
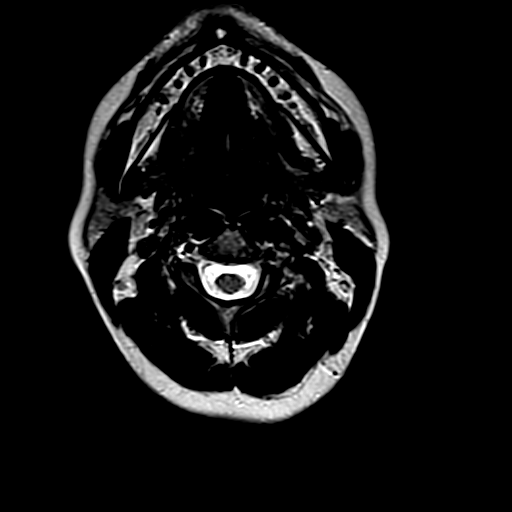

[23 of 48 positions shown; findings below may reference images not displayed]

FINDINGS: No acute bony lesions of cervical vertebrae. Structures of the foramen magnum are normal in the sagittal view. 

C2-3 disc shows no focal lesions. 

At C3-4 level, degenerative disc changes with mild compromise of both neural foramina, left more than the right.

At C4-5 level, mild degenerative disc disease without significant compromise of thecal sac or neural foramina.

At C5-6 level, significant degenerative disc disease with osteophyte complex causing significant left foraminal narrowing with bulging annulus causing moderate right foraminal narrowing.  Mild compromise of thecal sac with AP diameter in the midline measuring 9.1 mm.

At C6-7 level, degenerative disc disease with prominent osteophyte complex on both sides. Severe right foraminal narrowing completely obliterating the neural foramina at C6-7 level impinging on the right C7 nerve root.  Osteophyte complex causing significant left foraminal narrowing at this level.  Mild compromise of thecal sac with AP diameter in the midline measuring 9 mm. 

C7-T1 disc is normal.  Cervical spinal cord shows no focal lesions. Paravertebral soft tissues are unremarkable.
IMPRESSION: 1.  No acute bony lesions of cervical vertebrae. 

2.  C6-7 level, degenerative disc disease with prominent osteophyte complex on both sides. Severe right foraminal narrowing completely obliterating the neural foramina at C6-7 level impinging on the right C7 nerve root.  Osteophyte complex causing significant left foraminal narrowing at this level.  Mild compromise of thecal sac with AP diameter in the midline measuring 9 mm.

3. At C5-6 level, significant degenerative disc disease with osteophyte complex causing significant left foraminal narrowing with bulging annulus causing moderate right foraminal narrowing.  Mild compromise of thecal sac with AP diameter in the midline measuring 9.1 mm.

4. Findings at other disc levels are described above in detail.  No focal lesions of the cervical spinal cord.

5. Overall, above-mentioned findings are chronic in nature.

## 2023-05-22 ENCOUNTER — Ambulatory Visit (HOSPITAL_COMMUNITY)
Admission: RE | Admit: 2023-05-22 | Discharge: 2023-05-22 | Disposition: A | Discharge status: Still In Hospital | Payer: BC Managed Care – PPO | Source: Ambulatory Visit | Attending: Family | Admitting: Family

## 2023-05-22 ENCOUNTER — Other Ambulatory Visit: Payer: Self-pay

## 2023-05-22 ENCOUNTER — Other Ambulatory Visit (HOSPITAL_COMMUNITY): Payer: Self-pay | Admitting: Family

## 2023-05-22 ENCOUNTER — Encounter (HOSPITAL_COMMUNITY): Payer: Self-pay

## 2023-05-22 DIAGNOSIS — M47812 Spondylosis without myelopathy or radiculopathy, cervical region: Secondary | ICD-10-CM

## 2023-05-22 DIAGNOSIS — M5030 Other cervical disc degeneration, unspecified cervical region: Secondary | ICD-10-CM

## 2023-05-22 DIAGNOSIS — M47819 Spondylosis without myelopathy or radiculopathy, site unspecified: Secondary | ICD-10-CM | POA: Insufficient documentation

## 2023-05-22 DIAGNOSIS — M62838 Other muscle spasm: Secondary | ICD-10-CM | POA: Insufficient documentation

## 2023-05-22 NOTE — PT Evaluation (Signed)
[facility]  Outpatient Physical Therapy  [address]  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 She notes on June 26th she was performing a transfer when the patient began to fall. This jerked her and she has been on light duty. She reports prior to this incident she was able to manage this pain on her own but this is no longer providing her relief. She is currently using heat/ice and ibuprophen which helps some.     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 completley obliterating the neural foramina at C6-7   --multilevel DDD  --C5-6 significant DDD osteophyte     Patient goals: REDUCE PAIN and NORMALIZE FUNCTION    Occupation:  PT    Next MD visit: 05/27/23    Pain location: neck pain with R cervical radiculopathy                     Pain description: ACHING and TINGLING    Pain frequency:  CONTINUOUS    Pain rating: Now 6/10   Best 3/10   Worst 10/10    Radiculopathy: R UE n/t pain     Pain increases with:  looking to right and looking down            decreases with :  heat/ice, ibuprofen     Sensation: R UE     Weakness: R UE     Sleep affected: wakes 3-4x night     Headaches: yes    Dizziness: denies     Subjective Functional Reports:    Sitting: WFL    Standing: WFL    Walking: WFL    Lifting: LIMITED        Patient-Specific Functional Score: gather info at next session.     Problem Score   1.     2.     3.           OBJECTIVE    ROM   right left    rotation 52 60   sidebend 25 30   flexion 35 ----------   extension 38 -----------       Strength: BIL UE and cervical MMT WFL     Palpation: tenderness to palpation of R > L UT/LS, sub-occipitals, and pec minor     Posture: FORWARD HEAD    Special tests:   --JAMAR Grip R 37, L 45    Treatment provided:REVIEW OF POC AND GOALS WITH PATIENT, ALL QUESTIONS ANSWERED, PATIENT EDUCATION, and THERAPEUTIC EXERCISE     --cervical retractions + cervical extension  --pec minor stretch on towel roll/pool noodle  --education about at home traction unit      ASSESSMENT    Impression: Patient is a 42 y/o female referred to OP PT 2/2 cervical degenerative disc changes and facet arthropathy. Patient had recent MRI on 05/16/23 which revealed "C6-7  DDD with severe right foraminal narrowing completley obliterating the neural foramina at C6-7, multilevel DDD, and C5-6 significant DDD osteophyte." Patient presents with R UE cervical radiculopathy, decreased grip strength on R, cervicothoracic region musculoskeletal dysfunction with BIL UT's and sub-occipitals displaying hypersensitivity and increased tension, and R > L pec tightness. Patient responded well to initial treatment this date including manual cervical distraction and sub-occipital release. She reports centralization and reduction of pain with cervical retractions + cervical extension exercise. Patient educated about at home cervical traction unit and proper usage. Patient verbalizes understanding and agreement with PT POC and HEP training. Patient would benefit from skilled PT services at this time to address limitations noted during evaluation and improve patient's functional mobility and QOL.         Rehab potential: GOOD      Short Term Goals: 3 Weeks   -Knowledge of proper body mechanics.   -Decrease in pain. Intermittent vs. constant. Worst SPS rating less than 7/10.  -Patient will self report greater than or equal to 25% improvement with progress towards reaching  functional goals and improving QOL.       Long Term Goals: 6 Weeks   -Abolish radicular symptoms. Worst SPS rating less than 3/10.   -Patient will demo independence with HEP to maximize functional outcomes.   -Patient will demonstrate improved muscular dysfunction in the cervicothoracic region to improve flexibility, strength, and reduce pain.   -Patient will improve R grip strength to >/= 95% of L grip strength to improve R UE functional activity tolerance.  -Patient will improve R cervical rotation and side-bending AROM to 95% or greater of L cervical rotation and side-bending AROM for improved functional mobility and activity participation.              PLAN  Patient will attend 1-2 times per week x 6 weeks. Therapy may include, but is not limited to THERAPEUTIC EXERCISES, MYOFASCIAL/JOINT MOBILIZATION, POSTURE/BODY MECHANICS, ERGONOMIC TRAINING, HOME INSTRUCTIONS, HEAT/COLD, ULTRASOUND, ELECTRICAL STIMULATION, KINESIOTAPE, and MECHANICAL TRACTION    Plan for next visit: review and progress HEP as tolerated, manual therapy/modalities as appropriate, mechanical cervical traction if warranted (no contraindications noted during evaluation)       Evaluation complexity:   Personal factors impacting POC: FREQUENT OR CHRONIC PAIN and OCCUPATIONAL ADLS (IE HEAVY LIFTING, REPETITIVE TASKS, LONG HOURS)   Co-morbidities impacting POC: PSYCHIATRIC DIAGNOSIS  Complexity of physical exam: INCLUDING MUSCULOSKELETAL SYSTEM (POSTURE, ROM, STRENGTH, HEIGHT/WEIGHT) and REFERRAL IS FOR A CHRONIC PROBLEM   Clinical Presentation: STABLE   Evaluation Complexity: LOW-HISTORY 0, EXAMINATION 1-2, STABLE PRESENTATION        Total Session Time 43, Timed code minutes 10, and Untimed code minutes 33        Intervention minutes: EVALUATION 33 minutes and THERAPEUTIC EXERCISE 10 minutes    Unk Lightning, PT  05/22/2023, 10:59                          Certification:    From:______  Through:_________    I certify the need for these services  furnished under this plan of treatment and while under my care.    Referring Provider Signature: _______________     Date : _____________________

## 2023-05-23 ENCOUNTER — Ambulatory Visit
Admission: RE | Admit: 2023-05-23 | Discharge: 2023-05-23 | Disposition: A | Discharge status: Still In Hospital | Payer: BC Managed Care – PPO | Source: Ambulatory Visit | Attending: Family | Admitting: Family

## 2023-05-23 DIAGNOSIS — M47812 Spondylosis without myelopathy or radiculopathy, cervical region: Secondary | ICD-10-CM | POA: Insufficient documentation

## 2023-05-23 DIAGNOSIS — M62838 Other muscle spasm: Secondary | ICD-10-CM | POA: Insufficient documentation

## 2023-05-23 DIAGNOSIS — M5030 Other cervical disc degeneration, unspecified cervical region: Secondary | ICD-10-CM

## 2023-05-23 NOTE — PT Treatment (Addendum)
[facility]  Outpatient Physical Therapy  [address]  [place], [address]  [phone]  (Fax) [phone]    Physical Therapy Treatment Note    Date: 05/23/2023  Patient's Name: Sara Grant  Date of Birth: 21-Aug-1981  Physical Therapy Visit            Visit #/POC:2 of 12  Authorization:??  POC Signed?: ??  POC Ends: ??  Next Progress Note Due: ??      Evaluating Physical Therapist: Unk Lightning, DPT  PT diagnosis/Reason for Referral: cervical radiculopathy   Next Scheduled Physician Appointment: TBA  Allergies/Contraindications: NKA      DISCHARGE NOTE  Patient has not followed up with PT and will be discharged at this time.  No formal reassessment has been made due to patient attending 2 sessions of the 12 total POC with 1 cancels and 0 no-shows.  Interventions included therex and manual therapy for the treatment cervical radiculopathy.  Date of service: 05/22/23 - 05/23/23.      Subjective: Patient states new stretches instructed yesterday seems to have helped. Patient has ordered home traction unit. Mild radicular symptoms noted on RUE/hand.    Objective: Manual techniques applied to cervical spine along with passive stretching to upper traps. Patient had increased radicular symptoms on R hand when stretched R UT. Symptoms decreased when head was bent to the R, stretching out L UT. Ended with pectoralis stretches on 1/2 roll in conjunction with UE exercises. Ended with CP to R side of neck/UT x 10 min.    Patient-Specific Functional Score:      Problem Score   1. Perform transfer 5   2. Carrying pitcher with R hand 6   3. Driving 1 hour 3   Total 4.6       Measured ROM: Not assessed today, 8/15  EXERCISE/ACTIVITY NAME REPETITIONS RESISTANCE COMPLETED THIS DOS   Manual Tx     y   Up and down glides, bil    PA Mobs   Y    y   Passive UT stretch    R 1st rib mob   Y    y   1/2 roll pec stretch    1/2 roll wand flexion 2 min    10  Y    y                                        Assessment: Good response to session. Assessed tricep extension (C7), R was 4/5, L was 5/5. Increased tenderness also noted at CT junction.    Short Term Goals: 3 Weeks               -Knowledge of proper body mechanics.               -Decrease in pain. Intermittent vs. constant. Worst SPS rating less than 7/10.  -Patient will self report greater than or equal to 25% improvement with progress towards reaching functional goals and improving QOL.         Long Term Goals: 6 Weeks               -Abolish radicular symptoms. Worst SPS rating less than 3/10.               -Patient will demo independence with HEP to maximize functional outcomes.   -Patient will demonstrate improved muscular dysfunction  in the cervicothoracic region to improve flexibility, strength, and reduce pain.   -Patient will improve R grip strength to >/= 95% of L grip strength to improve R UE functional activity tolerance.  -Patient will improve R cervical rotation and side-bending AROM to 95% or greater of L cervical rotation and side-bending AROM for improved functional mobility and activity participation.    Plan: Proceed as tol.    Total Session Time 45, Timed code minutes 30, and Untimed code minutes 10  THERAPEUTIC EXERCISE 10 minutes and JOINT MOBILIZATION/MFR 20 minutes      Laurence Aly, PTA  05/23/2023, 15:19

## 2023-05-27 ENCOUNTER — Ambulatory Visit (HOSPITAL_COMMUNITY): Payer: Self-pay

## 2023-05-29 ENCOUNTER — Ambulatory Visit (HOSPITAL_COMMUNITY): Payer: Self-pay

## 2023-07-16 ENCOUNTER — Ambulatory Visit (INDEPENDENT_AMBULATORY_CARE_PROVIDER_SITE_OTHER): Payer: BC Managed Care – PPO | Admitting: Physician Assistant

## 2023-07-16 ENCOUNTER — Other Ambulatory Visit: Payer: Self-pay

## 2023-07-16 ENCOUNTER — Encounter (INDEPENDENT_AMBULATORY_CARE_PROVIDER_SITE_OTHER): Payer: Self-pay | Admitting: Physician Assistant

## 2023-07-16 VITALS — BP 130/99 | HR 86 | Ht 60.0 in | Wt 136.2 lb

## 2023-07-16 DIAGNOSIS — R319 Hematuria, unspecified: Secondary | ICD-10-CM

## 2023-07-16 DIAGNOSIS — N281 Cyst of kidney, acquired: Secondary | ICD-10-CM

## 2023-07-16 DIAGNOSIS — Z87448 Personal history of other diseases of urinary system: Secondary | ICD-10-CM

## 2023-07-16 DIAGNOSIS — Z87440 Personal history of urinary (tract) infections: Secondary | ICD-10-CM

## 2023-07-16 NOTE — Progress Notes (Unsigned)
[facility]  UROLOGY, [facility]    Progress Note    Name: Rodina Pinales MRN:  [id number]   Date: 07/16/2023 Age: 42 y.o.       Chief Complaint: Follow Up 6 Months, Recurrent Urinary Tract Infections, and Blood in urine    Subjective:   Ms. Huebert is a pleasant 42 year old female with a history of recurrent urinary tract infections and microscopic hematuria as well as a simple renal cyst.   Patient with suspected gross hematuria last month, shortly before menstruating. She states that the trigger for her urinary tract infections are when she holds her bladder for long periods of time due to stressors at work not allowing her to use the bathroom as often as she needs to.  Now that she is no longer holding her urine for long stretches. Patient reports 2 episodes of UTI since last visit. She denies fevers, chills, nausea, vomiting, hematuria, dysuria, flank pain, incontinence, dribbling, hesitancy, suprapubic pain, headaches, vision changes, shortness of breath, chest pain. Patient continues to take Cranberry supplements.    Today, patient reports she is doing well overall. No UTIs since last visit. Patient reprot    Objective :  BP (!) 130/99 (Site: Right Arm, Patient Position: Sitting, Cuff Size: Adult)   Pulse 86   Ht 1.524 m (5')   Wt 61.8 kg (136 lb 3.2 oz)   BMI 26.60 kg/m       Gen: NAD, alert  Pulm: unlabored at rest  CV: palpable pulses  Abd: soft, Nt/ND  GU: no suprapubic tenderness, no CVAT    Data reviewed:    Current Outpatient Medications   Medication Sig    atorvastatin (LIPITOR) 20 mg Oral Tablet Take 1 Tablet (20 mg total) by mouth Once a day as directed    buPROPion (WELLBUTRIN XL) 150 mg extended release 24 hr tablet     cholecalciferol, vitamin D3, 250 mcg (10,000 unit) Oral Capsule Take 1 Capsule (10,000 Units total) by mouth Once a day    citalopram (CELEXA) 40 mg Oral Tablet Take 1 Tablet (40 mg total) by mouth Once a day    loratadine (CLARITIN) 10 mg Oral Tablet Take 1  Tablet (10 mg total) by mouth Once a day    LORazepam (ATIVAN) 0.5 mg Oral Tablet Take 1 Tablet (0.5 mg total) by mouth Twice per day as needed    losartan-hydrochlorothiazide (HYZAAR) 50-12.5 mg Oral Tablet     propranoloL (INDERAL) 40 mg Oral Tablet Take 1 Tablet (40 mg total) by mouth Once a day     Assessment/Plan  Problem List Items Addressed This Visit    None    Recurrent UTI  I discussed the epidemiology, pathogenesis, and prevention of recurrent acute uncomplicated cystitis and pyelonephritis of recurrent urinary tract infections with patient  We discussed that, while bothersome, there is currently no evidence linking recurrent infections to significant health problems, such as hypertension or renal disease, in the absence of anatomic or functional abnormalities of the urinary tract.  We spent the majority of our time discussing various prevention strategies, including:  Behavioral modifications:  If sexually active, avoidance of spermicides  While controlled studies have not shown post-coital voiding and liberal fluid intake to be associated with a reduced risk of recurrent UTI, these measures are unlikely to be harmful  Cranberry compounds:  While there is likely little harmful effect other than an increase in calorie and glucose intake with cranberry juice, there remains a limited proven role; however,  consideration may be given to a well-studied cranberry pill supplements such as TheraCran HP (Theralogix) which has a standardized formula and has been shown in scientific studies to promote urinary tract health  Antimicrobial prophylaxis options (continuous, postcoital, or self-start therapy) were also discussed for women who experience two or more symptomatic UTIs within six months or three or more over 12 months  We discussed balancing the degree of discomfort experienced from these infections with real concerns about antimicrobial resistance  The prophylactic efficacy of any antimicrobial agent  depends upon the continued susceptibility to the drug of potential uropathogens colonizing patient's fecal, periurethral, and vaginal flora and must be weighed against emerging uropathogen resistance patterns  Topical estrogen for postmenopausal women as a means to increase the prevalence of lactobacilli and decrease in E. 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  We spent considerable time personally reviewing relevant imaging which are consistent with a simple renal cyst  I discussed the nature of incompletely characterized renal cyst warranting further dedicated imaging for further lesion characterization  I discussed the nature of simple appearing Bosniak I renal cyst which have been reported to affect more than 50% of patients over the age of 61 and warrant no further workup, surveillance or intervention if asymptomatic; however, repeat ultrasonography may be considered at 6 to 12 months in selected patients in order to confirm stability and a correct diagnosis  I discussed the nature of minimally complex Bosniak II renal cyst which pose limited oncologic potential and generally do not warrant further radiographic surveillance; however, repeat ultrasonography may be considered at 6 to 12 months in selected patients in order to confirm stability and a correct diagnosis  I discussed the nature of  complex Bosniak IIF renal cyst which has a reported incidence of malignancy ranging from 5-20% and accordingly warrants further radiographic surveillance  I discussed the nature of complex Bosniak III renal cyst which carries nearly a 50% risk of malignancy and warrants surgical extirpation  I discussed the nature of complex Bosniak IV renal cyst which carries nearly a 90% risk of malignancy and warrants surgical extirpation  Based on patient's clinical and radiographic findings, I would recommend no further imaging at this time  Generally the indications for surgical intervention for renal cysts include suspected malignancy, pain attributable to cyst expansion, cyst infection and angiotensin-dependent hypertension.  We also discussed the limited role of percutaneous renal fine-needle aspiration (FNA) biopsy; unfortunately, biopsy cannot reliably distinguish a benign renal cyst from the less common cystic renal cell carcinoma.  Preoperative needle biopsy has generally not been recommended for resectable renal lesions, because of concern about seeding of the peritoneum.      History of microscopic hematuria, low risk; cystoscopy 01/14/23  I discussed the differential diagnosis, pathophysiology and nature of asymptomatic microscopic hematuria with patient.  We discussed the that patient falls into the low risk category for genitourinary malignancy as defined by the 2020 AUA Guidelines on  microscopic hematuria and the need for further evaluation including:  Renal US reviewed, MRI reviewed  Evaluation: cystoscopy completed 01/14/23  Risks of cystourethroscopy including infection, discomfort, transient hematuria, as well as benefits and alternatives were discussed, if pertinent, with patient in detail who voiced agreement and understanding

## 2023-09-16 ENCOUNTER — Other Ambulatory Visit (HOSPITAL_COMMUNITY): Payer: Self-pay | Admitting: Family

## 2023-09-16 DIAGNOSIS — Z1231 Encounter for screening mammogram for malignant neoplasm of breast: Secondary | ICD-10-CM

## 2023-09-26 ENCOUNTER — Ambulatory Visit (HOSPITAL_COMMUNITY): Payer: Self-pay

## 2023-10-03 ENCOUNTER — Encounter (HOSPITAL_COMMUNITY): Payer: Self-pay

## 2023-10-03 ENCOUNTER — Ambulatory Visit
Admission: RE | Admit: 2023-10-03 | Discharge: 2023-10-03 | Disposition: A | Discharge status: Home / Self Care | Payer: No Typology Code available for payment source | Source: Ambulatory Visit | Attending: Family | Admitting: Family

## 2023-10-03 ENCOUNTER — Other Ambulatory Visit: Payer: Self-pay

## 2023-10-03 DIAGNOSIS — Z1231 Encounter for screening mammogram for malignant neoplasm of breast: Secondary | ICD-10-CM | POA: Insufficient documentation

## 2023-10-16 ENCOUNTER — Encounter (HOSPITAL_COMMUNITY): Payer: Self-pay

## 2023-10-16 ENCOUNTER — Other Ambulatory Visit: Payer: Self-pay

## 2023-10-16 ENCOUNTER — Emergency Department
Admission: EM | Admit: 2023-10-16 | Discharge: 2023-10-16 | Disposition: A | Discharge status: Home / Self Care | Payer: No Typology Code available for payment source | Attending: Emergency Medicine | Admitting: Emergency Medicine

## 2023-10-16 ENCOUNTER — Emergency Department (HOSPITAL_COMMUNITY): Payer: No Typology Code available for payment source

## 2023-10-16 DIAGNOSIS — R109 Unspecified abdominal pain: Secondary | ICD-10-CM

## 2023-10-16 DIAGNOSIS — F419 Anxiety disorder, unspecified: Secondary | ICD-10-CM | POA: Insufficient documentation

## 2023-10-16 DIAGNOSIS — M62838 Other muscle spasm: Secondary | ICD-10-CM | POA: Insufficient documentation

## 2023-10-16 DIAGNOSIS — R519 Headache, unspecified: Secondary | ICD-10-CM | POA: Insufficient documentation

## 2023-10-16 DIAGNOSIS — R0602 Shortness of breath: Secondary | ICD-10-CM

## 2023-10-16 LAB — ECG 12 LEAD
Atrial Rate: 74 {beats}/min
Calculated P Axis: 72 degrees
Calculated R Axis: 75 degrees
Calculated T Axis: 80 degrees
PR Interval: 174 ms
QRS Duration: 84 ms
QT Interval: 390 ms
QTC Calculation: 432 ms
Ventricular rate: 74 {beats}/min

## 2023-10-16 LAB — URINALYSIS, MACROSCOPIC
BILIRUBIN: NEGATIVE mg/dL
BLOOD: 0.5 mg/dL — AB
GLUCOSE: NEGATIVE mg/dL
KETONES: NEGATIVE mg/dL
LEUKOCYTES: NEGATIVE WBCs/uL
NITRITE: NEGATIVE
PH: 7 (ref 5.0–9.0)
PROTEIN: 100 mg/dL — AB
SPECIFIC GRAVITY: 1.015 (ref 1.002–1.030)
UROBILINOGEN: NORMAL mg/dL

## 2023-10-16 LAB — URINALYSIS, MICROSCOPIC
RBCS: 9 /[HPF] — ABNORMAL HIGH (ref <4)
SQUAMOUS EPITHELIAL: <1 /[HPF] (ref <28)
WBCS: <1 /[HPF] (ref <6)

## 2023-10-16 MED ORDER — KETOROLAC 60 MG/2 ML INTRAMUSCULAR SOLUTION
INTRAMUSCULAR | Status: AC
Start: 2023-10-16 — End: 2023-10-16
  Filled 2023-10-16: qty 2

## 2023-10-16 MED ORDER — DICLOFENAC SODIUM 75 MG TABLET,DELAYED RELEASE
75.0000 mg | DELAYED_RELEASE_TABLET | Freq: Two times a day (BID) | ORAL | 0 refills | Status: DC
Start: 2023-10-16 — End: 2024-04-16

## 2023-10-16 MED ORDER — KETOROLAC 60 MG/2 ML INTRAMUSCULAR SOLUTION
60.0000 mg | INTRAMUSCULAR | Status: AC
Start: 2023-10-16 — End: 2023-10-16
  Administered 2023-10-16: 60 mg via INTRAMUSCULAR

## 2023-10-16 NOTE — ED Triage Notes (Addendum)
Headache with sob on way to work chest tightness states has hx anxiety  took tylenol 1000 mg headache better.

## 2023-10-16 NOTE — ED Provider Notes (Signed)
[facility]  Emergency Department  Attending Provider Note      CHIEF COMPLAINT  Chief Complaint   Patient presents with    Headache     HISTORY OF PRESENT ILLNESS  Sara Grant, date of birth [birth year], is a 43 y.o. female who presented to the Emergency Department.    THE PATIENT PRESENTS EMERGENCY DEPARTMENT TODAY WITH A STATED COMPLAINT OF HEADACHE.  ALSO HAD SOME CHEST TIGHTNESS AND SHORTNESS OF BREATH WHICH SHE ATTRIBUTED TO ANXIETY.  SYMPTOMS HAVE BEEN MUCH BETTER SINCE INITIALLY BEGINNING PRIOR TO ARRIVAL.  NO FEVERS CHILLS.  SOME PALPITATIONS AT THE TIME.  NO COUGH DYSURIA HEMATURIA NAUSEA OR VOMITING REPORTED HOWEVER.  COMPREHENSIVE 10+ REVIEW OF SYSTEMS IS OTHERWISE NEGATIVE.  NO OTHER ACUTE COMPLAINTS REPORTED TO ME BY THE PATIENT.    PAST MEDICAL/SURGICAL/FAMILY/SOCIAL HISTORY  Past Medical History:   Diagnosis Date    Anxiety     Chronic urinary tract infection     Depression     Hypertension     Microscopic hematuria     OAB (overactive bladder)        Past Surgical History:   Procedure Laterality Date    HX CESAREAN SECTION      x2       Family Medical History:       Problem Relation (Age of Onset)    Benign prostatic hyperplasia Father    Diabetes Other    Hypertension (High Blood Pressure) Other    No Known Problems Mother, Sister, Brother, Maternal Grandmother, Maternal Grandfather, Paternal Grandmother, Paternal Grandfather, Daughter, Son, Maternal Aunt, Maternal Uncle, Paternal Aunt, Paternal Uncle          Social History     Socioeconomic History    Marital status: Married   Tobacco Use    Smoking status: Never    Smokeless tobacco: Never   Vaping Use    Vaping status: Never Used   Substance and Sexual Activity    Alcohol use: Yes     Comment: Occasional    Drug use: Never     Social Determinants of Health     Financial Resource Strain: Low Risk  (04/05/2022)    Financial Resource Strain     SDOH Financial: No   Transportation Needs: Low Risk  (04/05/2022)    Transportation  Needs     SDOH Transportation: No   Social Connections: Low Risk  (04/05/2022)    Social Connections     SDOH Social Isolation: 5 or more times a week   Intimate Partner Violence: Low Risk  (04/05/2022)    Intimate Partner Violence     SDOH Domestic Violence: No   Housing Stability: Low Risk  (04/05/2022)    Housing Stability     SDOH Housing Situation: I have housing.     SDOH Housing Worry: No      ALLERGIES  No Known Allergies    PHYSICAL EXAM  VITAL SIGNS:  Filed Vitals:    10/16/23 0934 10/16/23 1015 10/16/23 1115 10/16/23 1130   BP: (!) 145/115 (!) 144/96 (!) 137/98 126/88   Pulse: 85 72 73 67   Resp: 20 17 19 18    Temp: 37.2 C (98.9 F)      SpO2: 100% 100% 100% 100%     GENERAL: PATIENT IS ALERT AND ORIENTED TO PERSON, PLACE, AND TIME.  HEAD: NORMOCEPHALIC AND ATRAUMATIC.  EYES: PUPILS EQUALLY ROUND AND REACT TO LIGHT. EXTRAOCULAR MOVEMENTS INTACT.  EARS: GROSS HEARING INTACT. EXTERNAL EARS WITHIN  NORMAL LIMITS.  NOSE: NO SEPTAL DEVIATION. NASAL PASSAGES CLEAR.  THROAT: MOIST ORAL MUCOSA.   NECK: SUPPLE. TRACHEA MIDLINE.  HEART: REGULAR, RATE, AND RHYTHM.  LUNGS: CLEAR TO AUSCULTATION BILATERAL.  ABDOMEN: SOFT, NON-TENDER, NON-DISTENDED, AND BOWEL SOUNDS ARE PRESENT.  GENITOURINARY: DEFERRED.  RECTAL: DEFERRED.  EXTREMITIES: NO CYANOSIS, CLUBBING, OR EDEMA.  SKIN: WARM AND DRY.  MUSCULOSKELETAL: DEFERRED.  NEUROLOGIC: CRANIAL NERVES II THROUGH XII ARE GROSSLY INTACT. SENSATION TO LIGHT TOUCH IS INTACT.  PSYCHIATRIC: JUDGMENT AND INSIGHT ARE SEEMINGLY INTACT. MOOD AND AFFECT ARE APPROPRIATE FOR THE SITUATION.    DIAGNOSTICS  Labs:  Labs listed below were reviewed and interpreted by me.  Results for orders placed or performed during the hospital encounter of 10/16/23   URINALYSIS, MACROSCOPIC   Result Value Ref Range    COLOR Light Yellow Colorless, Light Yellow, Yellow    APPEARANCE Clear Clear    SPECIFIC GRAVITY 1.015 1.002 - 1.030    PH 7.0 5.0 - 9.0    LEUKOCYTES Negative Negative, 100  WBCs/uL    NITRITE  Negative Negative    PROTEIN 100 (A) Negative, 10 , 20  mg/dL    GLUCOSE Negative Negative, 30  mg/dL    KETONES Negative Negative, Trace mg/dL    BILIRUBIN Negative Negative, 0.5 mg/dL    BLOOD 0.5 (A) Negative, 0.03 mg/dL    UROBILINOGEN Normal Normal mg/dL   URINALYSIS, MICROSCOPIC   Result Value Ref Range    RBCS 9 (H) <4 /hpf    WBCS <1 <6 /hpf    SQUAMOUS EPITHELIAL <1 <28 /hpf   ECG 12 LEAD   Result Value Ref Range    Ventricular rate 74 BPM    Atrial Rate 74 BPM    PR Interval 174 ms    QRS Duration 84 ms    QT Interval 390 ms    QTC Calculation 432 ms    Calculated P Axis 72 degrees    Calculated R Axis 75 degrees    Calculated T Axis 80 degrees     Radiology:  Results for orders placed or performed during the hospital encounter of 10/16/23   XR CHEST AP     Status: None    Narrative    Gene Risdon      PROCEDURE DESCRIPTION: XRT CHEST ONE VIEW    CLINICAL :  SOB    COMPARISON:None          FINDINGS: A single view of the chest was obtained. No focal alveolar infiltrate is identified.  The heart size is within the limits of normal.  No pleural effusion or pneumothorax is noted.         Impression    No focal alveolar infiltrate is identified.        Radiologist location ID: [id number]         ED COURSE/MEDICAL DECISION MAKING  Medications Administered in the ED   ketorolac (TORADOL) 60mg /2 mL IM injection (60 mg IntraMUSCULAR Given 10/16/23 1030)      ED Course as of 10/16/23 1926   Wed Oct 16, 2023   0956 ECG 12 LEAD  TODAY I, Keva Darty A. Caryle Helgeson, D.O., PERSONALLY VISUALIZED THE PATIENT'S EKG TRACING.  I AM THE PRIMARY INTERPRETER.  NORMAL SINUS RHYTHM AT A RATE OF 74.  NO AV BLOCKS.  NORMAL AXIS.  BASELINE WANDERING WITH ARTIFACT.  NONSPECIFIC ST-T CHANGES.  NO LVH.  NO BUNDLE-BRANCH BLOCKS.   1107 XR CHEST AP  TODAY I, Mansfield Dann A. Kendry Pfarr, D.O., PERSONALLY VISUALIZED THE  PATIENT'S DIAGNOSTIC IMAGING STUDIES.  DEFER TO THE RADIOLOGIST'S REPORT FOR THE FINAL DEFINITIVE RADIOLOGY READING.  MY INITIAL  INDEPENDENT INTERPRETATION IS AS FOLLOWS, BUT NOT LIMITED TO:  NO OBVIOUS ACUTE CARDIOPULMONARY PATHOLOGY     1107 LEUKOCYTES: Negative  NORMAL      Medical Decision Making  Problems Addressed:  Anxiety: acute illness or injury  Headache: acute illness or injury  Muscle spasm: acute illness or injury    Amount and/or Complexity of Data Reviewed  Labs: ordered. Decision-making details documented in ED Course.  Radiology: ordered and independent interpretation performed. Decision-making details documented in ED Course.  ECG/medicine tests: ordered and independent interpretation performed. Decision-making details documented in ED Course.    Risk  Prescription drug management.  Diagnosis or treatment significantly limited by social determinants of health.      CLINICAL IMPRESSION  Clinical Impression   Headache (Primary)   Muscle spasm   Anxiety     DISPOSITION  Discharged       DISCHARGE MEDICATIONS  Discharge Medication List as of 10/16/2023 11:32 AM        START taking these medications    Details   diclofenac sodium (VOLTAREN) 75 mg Oral Tablet, Delayed Release (E.C.) Take 1 Tablet (75 mg total) by mouth Twice daily, Disp-30 Tablet, R-0, E-Rx             /Hue Frick A. Earlene Plater, DO, MBA   10/16/2023, 11:32   [facility]  Department of Emergency Medicine  [facility]    This note was partially generated using MModal Fluency Direct system, and there may be some incorrect words, spellings, and punctuation that were not noted in checking the note before saving.

## 2023-10-16 NOTE — Discharge Instructions (Addendum)
THE PATIENT IS TO FOLLOW UP WITH THE PRIMARY CARE PROVIDER AS SOON AS POSSIBLE BUT NO LATER THAN 3 DAYS FROM LEAVING THE EMERGENCY DEPARTMENT.  IF NO PRIMARY CARE PROVIDER EXISTS, THEN THE PATIENT IS INSTRUCTED TO ESTABLISH CARE WITH A PRIMARY CARE PROVIDER AS SOON AS POSSIBLE BUT NO LATER THAN 3 DAYS FROM LEAVING THE EMERGENCY DEPARTMENT.  FOLLOW-UP WITH ANY SPECIALIST PROVIDER AS INDICATED AS SOON AS POSSIBLE BUT NO LATER THAN 3 DAYS, IF APPLICABLE.  NOTIFY THE PRIMARY CARE PROVIDER THAT YOU WERE IN THE EMERGENCY DEPARTMENT WITHIN 24 HOURS OF DISCHARGE TO FOLLOW-UP ON YOUR RESULTS AND/OR TREATMENTS.  RETURN TO THE EMERGENCY DEPARTMENT IMMEDIATELY IF NEEDED, NO BETTER, WORSE, NEW SYMPTOMS ARISE, OR YOU CANNOT FOLLOW-UP WITH YOUR PRIMARY CARE PROVIDER AND/OR APPLICABLE SPECIALIST IN THE PRESCRIBED TIMEFRAME.

## 2023-10-16 NOTE — ED Nurses Note (Signed)
Patient discharged home with family.  AVS reviewed with patient.  A written copy of the AVS and discharge instructions was given to the patient.  Questions sufficiently answered as needed.  Patient encouraged to follow up with PCP as indicated.  In the event of an emergency, patient instructed to call 911 or go to the nearest emergency room.

## 2023-10-17 ENCOUNTER — Telehealth (HOSPITAL_COMMUNITY): Payer: Self-pay | Admitting: Family

## 2023-10-17 NOTE — Telephone Encounter (Signed)
Post Ed Follow-Up    Post ED Follow-Up:   Document completed and/or attempted interactive contact(s) after transition to home after emergency department stay.:   Transition Facility and relevant Date:   Discharge Date: 10/16/23  Discharge from [facility] Emergency Department?: Yes  Discharge Facility: [facility]  Contacted by: Dinah Beers, RN  Contact method: Patient/Caregiver Telephone, MyChart Patient Portal  Contact first attempt: 10/17/2023  2:38 PM  MyChart message sent?: Yes  Was the AVS reviewed with patient?: No  Medications prescribed: Yes  Unable to leave message. My Chart message sent: Nurse Navigator toll free number, resources available, and hours of operation provided to patient and/or caregiver. Dinah Beers, RN         Dinah Beers, RN

## 2024-01-02 ENCOUNTER — Encounter (HOSPITAL_COMMUNITY): Payer: Self-pay

## 2024-01-02 ENCOUNTER — Other Ambulatory Visit (HOSPITAL_COMMUNITY): Payer: Self-pay | Admitting: Family

## 2024-01-02 DIAGNOSIS — Q613 Polycystic kidney, unspecified: Secondary | ICD-10-CM

## 2024-02-13 ENCOUNTER — Other Ambulatory Visit (INDEPENDENT_AMBULATORY_CARE_PROVIDER_SITE_OTHER): Payer: Self-pay | Admitting: Nephrology

## 2024-02-13 DIAGNOSIS — E559 Vitamin D deficiency, unspecified: Secondary | ICD-10-CM

## 2024-02-13 DIAGNOSIS — N281 Cyst of kidney, acquired: Secondary | ICD-10-CM

## 2024-02-13 DIAGNOSIS — Q613 Polycystic kidney, unspecified: Secondary | ICD-10-CM

## 2024-02-26 ENCOUNTER — Encounter (INDEPENDENT_AMBULATORY_CARE_PROVIDER_SITE_OTHER): Payer: Self-pay | Admitting: Physician Assistant

## 2024-03-14 ENCOUNTER — Ambulatory Visit: Attending: Nephrology

## 2024-03-14 ENCOUNTER — Other Ambulatory Visit: Payer: Self-pay

## 2024-03-14 DIAGNOSIS — Q613 Polycystic kidney, unspecified: Secondary | ICD-10-CM | POA: Insufficient documentation

## 2024-03-14 DIAGNOSIS — N281 Cyst of kidney, acquired: Secondary | ICD-10-CM | POA: Insufficient documentation

## 2024-03-14 DIAGNOSIS — E559 Vitamin D deficiency, unspecified: Secondary | ICD-10-CM | POA: Insufficient documentation

## 2024-03-14 LAB — BASIC METABOLIC PANEL
ANION GAP: 2 mmol/L — ABNORMAL LOW (ref 4–13)
BUN/CREA RATIO: 18 (ref 6–22)
BUN: 19 mg/dL (ref 7–25)
CALCIUM: 9.1 mg/dL (ref 8.6–10.3)
CHLORIDE: 107 mmol/L (ref 98–107)
CO2 TOTAL: 26 mmol/L (ref 21–31)
CREATININE: 1.03 mg/dL (ref 0.60–1.30)
ESTIMATED GFR: 68 mL/min/{1.73_m2} (ref >59)
GLUCOSE: 99 mg/dL (ref 74–109)
OSMOLALITY, CALCULATED: 272 mosm/kg (ref 270–290)
POTASSIUM: 3.4 mmol/L — ABNORMAL LOW (ref 3.5–5.1)
SODIUM: 135 mmol/L — ABNORMAL LOW (ref 136–145)

## 2024-03-14 LAB — CBC WITH DIFF
BASOPHIL #: 0.1 10*3/uL (ref 0.00–0.10)
BASOPHIL %: 1 % (ref 0–1)
EOSINOPHIL #: 0.2 10*3/uL (ref 0.00–0.50)
EOSINOPHIL %: 5 % (ref 1–7)
HCT: 41.2 % (ref 31.2–41.9)
HGB: 14.1 g/dL (ref 10.9–14.3)
LYMPHOCYTE #: 1.4 10*3/uL (ref 1.10–3.10)
LYMPHOCYTE %: 26 % (ref 16–46)
MCH: 31.1 pg (ref 24.7–32.8)
MCHC: 34.2 g/dL (ref 32.3–35.6)
MCV: 90.9 fL (ref 75.5–95.3)
MONOCYTE #: 0.4 10*3/uL (ref 0.20–0.90)
MONOCYTE %: 7 % (ref 4–11)
MPV: 8.7 fL (ref 7.9–10.8)
NEUTROPHIL #: 3.4 10*3/uL (ref 1.90–8.20)
NEUTROPHIL %: 62 % (ref 43–77)
PLATELETS: 265 10*3/uL (ref 140–440)
RBC: 4.53 10*6/uL (ref 3.63–4.92)
RDW: 13 % (ref 12.3–17.7)
WBC: 5.4 10*3/uL (ref 3.8–11.8)

## 2024-03-14 LAB — PARATHYROID HORMONE (PTH): PTH: 47.7 pg/mL (ref 12.0–88.0)

## 2024-03-14 LAB — MICROALBUMIN/CREATININE RATIO, URINE, RANDOM
CREATININE RANDOM URINE: 124 mg/dL (ref 30–125)
MICROALBUMIN RANDOM URINE: 152 mg/dL
MICROALBUMIN/CREATININE RATIO RANDOM URINE: 1225.8 mg/g

## 2024-03-14 LAB — PROTEIN/CREATININE RATIO, URINE, RANDOM
CREATININE RANDOM URINE: 124 mg/dL (ref 30–125)
PROTEIN RANDOM URINE: 215 mg/dL — ABNORMAL HIGH (ref 50–80)
PROTEIN/CREATININE RATIO RANDOM URINE: 1.734 mg/mg (ref 0.000–200.000)

## 2024-03-14 LAB — URIC ACID: URIC ACID: 6.7 mg/dL (ref 2.3–7.6)

## 2024-03-14 LAB — VITAMIN D 25 TOTAL: VITAMIN D 25, TOTAL: 24.42 ng/mL — ABNORMAL LOW (ref 30.00–100.00)

## 2024-03-14 LAB — MAGNESIUM: MAGNESIUM: 2.1 mg/dL (ref 1.9–2.7)

## 2024-04-02 ENCOUNTER — Other Ambulatory Visit: Payer: Self-pay

## 2024-04-02 ENCOUNTER — Ambulatory Visit
Admission: RE | Admit: 2024-04-02 | Discharge: 2024-04-02 | Disposition: A | Discharge status: Home / Self Care | Source: Ambulatory Visit | Attending: Family | Admitting: Family

## 2024-04-02 DIAGNOSIS — Q613 Polycystic kidney, unspecified: Secondary | ICD-10-CM | POA: Insufficient documentation

## 2024-04-02 MED ORDER — BARIUM SULFATE 2 % (W/V) ORAL SUSPENSION
450.0000 mL | Freq: Once | ORAL | Status: AC
Start: 2024-04-02 — End: 2024-04-02
  Administered 2024-04-02: 450 mL via ORAL

## 2024-04-02 MED ORDER — IOHEXOL 350 MG IODINE/ML INTRAVENOUS SOLUTION
65.0000 mL | INTRAVENOUS | Status: AC
Start: 2024-04-02 — End: 2024-04-02
  Administered 2024-04-02: 65 mL via INTRAVENOUS

## 2024-04-16 ENCOUNTER — Other Ambulatory Visit: Payer: Self-pay

## 2024-04-16 ENCOUNTER — Encounter (INDEPENDENT_AMBULATORY_CARE_PROVIDER_SITE_OTHER): Payer: Self-pay | Admitting: Nephrology

## 2024-04-16 ENCOUNTER — Ambulatory Visit (INDEPENDENT_AMBULATORY_CARE_PROVIDER_SITE_OTHER): Payer: Self-pay | Admitting: Nephrology

## 2024-04-16 VITALS — BP 116/92 | HR 96 | Ht 60.0 in | Wt 118.0 lb

## 2024-04-16 DIAGNOSIS — Z7985 Long-term (current) use of injectable non-insulin antidiabetic drugs: Secondary | ICD-10-CM

## 2024-04-16 DIAGNOSIS — N182 Chronic kidney disease, stage 2 (mild): Secondary | ICD-10-CM

## 2024-04-16 DIAGNOSIS — D631 Anemia in chronic kidney disease: Secondary | ICD-10-CM

## 2024-04-16 DIAGNOSIS — Z8632 Personal history of gestational diabetes: Secondary | ICD-10-CM

## 2024-04-16 DIAGNOSIS — F419 Anxiety disorder, unspecified: Secondary | ICD-10-CM

## 2024-04-16 DIAGNOSIS — Q613 Polycystic kidney, unspecified: Secondary | ICD-10-CM

## 2024-04-16 DIAGNOSIS — E8881 Metabolic syndrome: Secondary | ICD-10-CM

## 2024-04-16 DIAGNOSIS — R809 Proteinuria, unspecified: Secondary | ICD-10-CM

## 2024-04-16 DIAGNOSIS — E559 Vitamin D deficiency, unspecified: Secondary | ICD-10-CM

## 2024-04-16 DIAGNOSIS — I129 Hypertensive chronic kidney disease with stage 1 through stage 4 chronic kidney disease, or unspecified chronic kidney disease: Secondary | ICD-10-CM

## 2024-04-16 DIAGNOSIS — N281 Cyst of kidney, acquired: Secondary | ICD-10-CM

## 2024-04-16 DIAGNOSIS — E876 Hypokalemia: Secondary | ICD-10-CM

## 2024-04-16 MED ORDER — EMPAGLIFLOZIN 10 MG TABLET
10.0000 mg | ORAL_TABLET | Freq: Every day | ORAL | 4 refills | Status: AC
Start: 2024-04-16 — End: ?

## 2024-04-16 MED ORDER — ERGOCALCIFEROL (VITAMIN D2) 1,250 MCG (50,000 UNIT) CAPSULE
50000.0000 [IU] | ORAL_CAPSULE | ORAL | 0 refills | Status: AC
Start: 2024-04-16 — End: 2024-07-15

## 2024-04-16 MED ORDER — MOUNJARO 5 MG/0.5 ML SUBCUTANEOUS PEN INJECTOR
5.0000 mg | PEN_INJECTOR | SUBCUTANEOUS | Status: AC
Start: 2024-04-16 — End: ?

## 2024-04-16 MED ORDER — POTASSIUM CHLORIDE ER 10 MEQ CAPSULE,EXTENDED RELEASE
10.0000 meq | ORAL_CAPSULE | Freq: Every day | ORAL | 0 refills | Status: AC
Start: 2024-04-16 — End: 2024-05-16

## 2024-04-16 NOTE — Progress Notes (Signed)
 NEPHROLOGY, [facility]  [address]  [place] [address]    Progress Note    Name: Sara Grant MRN:  [id number]   Date: 04/16/2024 DOB:  09/02/1981 (43 y.o.)              Nephrology Out  Patient Visit       Reason for the visit/consultation:  Proteinuria  HPI : 43 y.o. female very pleasant with the physical therapy with past medical history of hypertension since age 42, dyslipidemia, gestational diabetes, chronic proteinuria and bladder infection since she was very young no recent bladder infection presenting to the clinic to establish care.    Patient denies edema.  No shortness of breath no chest pain.  Patient reports since she was very young she had history of protein in the urine blood in the urine and on and off bladder infections.  Patient denies chest pain.  No shortness of breath.  No fevers or chills.  Patient takes NSAIDs p.r.n. discussed with her stopping NSAIDs completely patient is on Mounjaro .      Past medical history hypertension, gestational diabetes, anxiety, dyslipidemia, cysts on the kidneys, proteinuria.    Family history positive for kidney disease in her mother who was on dialysis at age 33 and passed away   Social history no smoking no alcohol no recreational drugs    Lab work from other lab:  1.0 GFR 68  ROS:     Systematic review of 12 organ systems was negative except what mentioned in in the HPI.     OBJECTIVE:   BP (!) 116/92   Pulse 96   Ht 1.524 m (5')   Wt 53.5 kg (118 lb)   BMI 23.05 kg/m       General:  NAD, AAOx3  HEENT:  EOMI,  no icterus  NECK: No increased JVD.  Normal inspection   HEART: Normal S1 and S2. Regular rhythm. No murmurs or rubs. No chest wall tenderness   LUNGS: Clear to auscultation bilateral. No wheezes, rales, or rhonchi.   ABDOMEN: +BS, Soft, nontender and nondistended. No rebound or guarding present.   EXTREMITIES: No edema. No cyanosis or clubbing.No asterixis    NEURO : Normal speech, EOMI.       LABORATORY DATA:   Lab  Results   Component Value Date    BUN 19 03/14/2024    CREATININE 1.03 03/14/2024    BUNCRRATIO 18 03/14/2024    GFR 68 03/14/2024    SODIUM 135 (L) 03/14/2024    SODIUM 138 01/08/2022    POTASSIUM 3.4 (L) 03/14/2024    POTASSIUM 4.0 01/08/2022    CHLORIDE 107 03/14/2024    CHLORIDE 107 01/08/2022    CO2 26 03/14/2024    CO2 23 01/08/2022    ANIONGAP 2 (L) 03/14/2024    ANIONGAP 8 (L) 01/08/2022    CALCIUM 9.1 03/14/2024    ALBUMIN 3.9 01/08/2022    HGB 14.1 03/14/2024    HCT 41.2 03/14/2024    INTACTPTH 47.7 03/14/2024           MEDICATIONS:  Outpatient Medications Marked as Taking for the 04/16/24 encounter (Office Visit) with Helene Meter, MD   Medication Sig    atorvastatin (LIPITOR) 20 mg Oral Tablet Take 1 Tablet (20 mg total) by mouth Once a day as directed    buPROPion (WELLBUTRIN XL) 150 mg extended release 24 hr tablet     busPIRone (BUSPAR) 15 mg Oral Tablet Take 1 Tablet (15 mg  total) by mouth Twice daily    cholecalciferol, vitamin D3, 250 mcg (10,000 unit) Oral Capsule Take 1 Capsule (10,000 Units total) by mouth Once a day    citalopram (CELEXA) 40 mg Oral Tablet Take 1 Tablet (40 mg total) by mouth Once a day (Patient taking differently: Take 0.5 Tablets (20 mg total) by mouth Daily Taking 20 mg)    empagliflozin  (JARDIANCE ) 10 mg Oral Tablet Take 1 Tablet (10 mg total) by mouth Daily    ergocalciferol , vitamin D2, (DRISDOL ) 1,250 mcg (50,000 unit) Oral Capsule Take 1 Capsule (50,000 Units total) by mouth Every 7 days for 90 days    LORazepam (ATIVAN) 0.5 mg Oral Tablet Take 1 Tablet (0.5 mg total) by mouth Twice per day as needed    losartan-hydrochlorothiazide (HYZAAR) 50-12.5 mg Oral Tablet     potassium chloride  (MICRO K) 10 mEq Oral Capsule, Sustained Release Take 1 Capsule (10 mEq total) by mouth Daily for 30 days    propranoloL (INDERAL) 40 mg Oral Tablet Take 1 Tablet (40 mg total) by mouth Once a day    tirzepatide  (MOUNJARO ) 5 mg/0.5 mL Subcutaneous Pen Injector Inject 0.5 mL (5 mg total)  under the skin Every 7 days     Current Outpatient Medications   Medication Instructions    atorvastatin (LIPITOR) 20 mg, Daily    buPROPion (WELLBUTRIN XL) 150 mg extended release 24 hr tablet     busPIRone (BUSPAR) 15 mg, 2 TIMES DAILY    cholecalciferol (vitamin D3) 10,000 Units, Daily    citalopram (CELEXA) 40 mg, Daily    empagliflozin  (JARDIANCE ) 10 mg, Oral, Daily    ergocalciferol  (vitamin D2) (DRISDOL ) 50,000 Units, Oral, EVERY 7 DAYS    loratadine (CLARITIN) 10 mg, Oral, Daily    LORazepam (ATIVAN) 0.5 mg, 2 TIMES DAILY PRN    losartan-hydrochlorothiazide (HYZAAR) 50-12.5 mg Oral Tablet No dose, route, or frequency recorded.    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    -Avoid NSAIDs.  Tylenol only for pain  -Renal imaging, CT scan indicating cysts but no polycystic kidney disease  -ACEI or ARBS:  Yes losartan with HCTZ  -Sodium Glucose Cotransporter-2 (SGLT2) Inhibitors:  Start Jardiance  10, risks and benefits were discussed with the patient including GU infection hypotension  -continue Mounjaro  5  -continue propranolol for anxiety and hypertension    Hypokalemia  -due to HCTZ we will replace    Proteinuria  -add Jardiance  continue losartan if not better  may consider serology workup on kidney biopsy for now most likely related to long history of hypertension continue Mounjaro  continue losartan    CKD bone mineral disease  Lab Results   Component Value Date    INTACTPTH 47.7 03/14/2024    VITAMIND25 24.42 (L) 03/14/2024        Vitamin-D deficiency noted we will replace     Hypertension  -Blood pressure is acceptable  -Goal less than 130/80  -Continue losartan 50 with HCTZ 12.5  -Low-salt diet    History of gestational diabetes  -monitor blood sugars.  May check A1c down the road    Metabolic syndrome rule out gout  -Check uric acid   Lab Results   Component Value Date    URICACID 6.7 03/14/2024       -Low uric acid diet  -continue current management    Anemia of CKD  -no  - HB   Lab Results   Component Value Date    HGB 14.1 03/14/2024               Orders Placed This Encounter    BASIC METABOLIC PANEL    CBC/DIFF    MAGNESIUM    MICROALBUMIN/CREATININE RATIO, URINE, RANDOM    PARATHYROID  HORMONE (PTH)    PROTEIN/CREATININE RATIO, URINE, RANDOM    URIC ACID    VITAMIN D  25 TOTAL    tirzepatide  (MOUNJARO ) 5 mg/0.5 mL Subcutaneous Pen Injector    potassium chloride  (MICRO K) 10 mEq Oral Capsule, Sustained Release    empagliflozin  (JARDIANCE ) 10 mg Oral Tablet    ergocalciferol , vitamin D2, (DRISDOL ) 1,250 mcg (50,000 unit) Oral Capsule        Ardis Fullwood, MD

## 2024-04-27 ENCOUNTER — Other Ambulatory Visit (HOSPITAL_COMMUNITY): Payer: Self-pay

## 2024-04-27 DIAGNOSIS — Z1231 Encounter for screening mammogram for malignant neoplasm of breast: Secondary | ICD-10-CM

## 2024-06-01 ENCOUNTER — Ambulatory Visit: Attending: OBSTETRICS/GYNECOLOGY | Admitting: OBSTETRICS/GYNECOLOGY

## 2024-06-01 DIAGNOSIS — R87612 Low grade squamous intraepithelial lesion on cytologic smear of cervix (LGSIL): Secondary | ICD-10-CM | POA: Insufficient documentation

## 2024-06-03 DIAGNOSIS — N870 Mild cervical dysplasia: Secondary | ICD-10-CM

## 2024-06-03 LAB — SURGICAL PATHOLOGY SPECIMEN

## 2024-08-04 ENCOUNTER — Emergency Department
Admission: EM | Admit: 2024-08-04 | Discharge: 2024-08-04 | Disposition: A | Discharge status: Home / Self Care | Payer: Worker's Compensation

## 2024-08-04 ENCOUNTER — Other Ambulatory Visit: Payer: Self-pay

## 2024-08-04 ENCOUNTER — Emergency Department (HOSPITAL_COMMUNITY): Payer: Worker's Compensation

## 2024-08-04 DIAGNOSIS — Y93F2 Activity, caregiving, lifting: Secondary | ICD-10-CM | POA: Insufficient documentation

## 2024-08-04 DIAGNOSIS — W1830XA Fall on same level, unspecified, initial encounter: Secondary | ICD-10-CM | POA: Insufficient documentation

## 2024-08-04 DIAGNOSIS — S29012A Strain of muscle and tendon of back wall of thorax, initial encounter: Secondary | ICD-10-CM | POA: Insufficient documentation

## 2024-08-04 DIAGNOSIS — Y990 Civilian activity done for income or pay: Secondary | ICD-10-CM | POA: Insufficient documentation

## 2024-08-04 DIAGNOSIS — M79641 Pain in right hand: Secondary | ICD-10-CM | POA: Insufficient documentation

## 2024-08-04 DIAGNOSIS — S63619A Unspecified sprain of unspecified finger, initial encounter: Secondary | ICD-10-CM

## 2024-08-04 MED ORDER — METHOCARBAMOL 500 MG TABLET
500.0000 mg | ORAL_TABLET | ORAL | 0 refills | Status: AC | PRN
Start: 2024-08-04 — End: 2024-08-07

## 2024-08-04 MED ORDER — DEXAMETHASONE SODIUM PHOSPHATE 4 MG/ML INJECTION SOLUTION
INTRAMUSCULAR | Status: AC
Start: 2024-08-04 — End: 2024-08-04
  Filled 2024-08-04: qty 1

## 2024-08-04 MED ORDER — DEXAMETHASONE SODIUM PHOSPHATE 4 MG/ML INJECTION SOLUTION
4.0000 mg | INTRAMUSCULAR | Status: AC
Start: 2024-08-04 — End: 2024-08-04
  Administered 2024-08-04: 4 mg via INTRAMUSCULAR

## 2024-08-04 NOTE — ED Triage Notes (Signed)
 [facility] - Emergency Department   Physician/APP in Triage Note  Medical Screening Exam     Date and Time of Assessment: 08/04/2024 12:41     Chief Complaint   Patient presents with    Finger Pain       Brief HPI:  A 43 year old female comes in with complaints of pain in her right hand.  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  She continues to have some mild pain with movement but no obvious deformity.  She was instructed to evaluated by her manage her.  Patient has not taken anything for pain prior to arrival    Focused Physical Exam:   ED Triage Vitals [08/04/24 1242]   BP (Non-Invasive) 117/88   Heart Rate 69   Respiratory Rate 16   Temperature 36.8 C (98.3 F)   SpO2 97 %   Weight 55.7 kg (122 lb 12.8 oz)   Height 1.524 m (5')     Nursing notes reviewed for what could be assessed. Past Medical, Surgical, and Social history reviewed for what has been completed.     Constitutional: NAD. Well-Developed. Well Nourished.  Head: Normocephalic, atraumatic.  Eyes: EOM grossly intact, conjunctiva normal.  Neck: Supple, Full range of motion  Cardiovascular: Regular Rate and Rhythm, extremities well perfused.  Pulmonary/Chest: No respiratory distress. Lungs are symmetric to auscultation   MSK: No Lower Extremity Edema.  Skin: Warm, dry, and intact.  Neuro: Appropriate, CN II-XII grossly intact. Gait steady  Psych: Pleasant      Preliminary Plan:  Imaging ordered

## 2024-08-04 NOTE — Discharge Instructions (Signed)
-  RIGHT HAND AND FINGER STRAIN: You have a strain in your right hand and fingers, specifically the third and fourth fingers, due to your fall. This means the muscles and tendons in your fingers were overstretched or torn. We recommend buddy taping the affected fingers to support them as they heal. Please follow up with your primary care physician to monitor your recovery.    -RIGHT TRAPEZIUS MUSCLE STRAIN: You have a strain in your right trapezius muscle, which is the muscle that extends from the back of your neck to your shoulder. This strain is likely due to your fall. We have prescribed Robaxin to help with the muscle strain. Continue taking Tylenol as needed for pain.    INSTRUCTIONS:  Please follow up with your primary care physician to monitor the healing of your right hand and fingers.       Contains text generated by Abridge

## 2024-08-04 NOTE — ED Nurses Note (Signed)
 Pt evaluated and treated by medical provider while in waiting room area. No primary RN assigned; therefore, no assessment performed. All paperwork given and questions answered. Pt acknowledged all understanding. Leaving waiting area   Middle finger buddy taped to index finger per provider.

## 2024-08-04 NOTE — ED Triage Notes (Signed)
 Pt presents to ED with c/o pain in the first, second, third, and fourth fingers on right hand. 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 Pt states the patient went to fall and her fingers were underneath the belt and were pulled.

## 2024-08-04 NOTE — ED Provider Notes (Signed)
 [facility] - Emergency Department  ED Primary Note  History of Present Illness  Sara Grant is a 43 year old female who presents with finger pain after a fall.    She experiences pain primarily in the third and fourth fingers of her right hand after attempting to slow her fall. There is a sensation of weakness in these fingers, raising concerns about potential further injury if the weakness persists. She is currently taking Tylenol as needed for pain management.    Additionally, she notes a mild strain in her right trapezoid muscle, which she attributes to the fall.  Physical Exam   ED Triage Vitals [08/04/24 1242]   BP (Non-Invasive) 117/88   Heart Rate 69   Respiratory Rate 16   Temperature 36.8 C (98.3 F)   SpO2 97 %   Weight 55.7 kg (122 lb 12.8 oz)   Height 1.524 m (5')     Physical Exam  Nursing notes and vital signs reviewed.     Constitutional - No acute distress.  Alert and Active.  HEENT - Normocephalic. Atraumatic. PERRL. EOMI. Conjunctiva clear. Moist mucous membranes.   Neck - Trachea midline. No stridor. No hoarseness.  Cardiac - Regular rate and rhythm. No murmurs, rubs, or gallops. Intact distal pulses.  Respiratory/Chest - Normal respiratory effort. Clear to auscultation bilaterally. No rales, wheezes or rhonchi. No chest tenderness.  Musculoskeletal - Good AROM. No muscle or joint tenderness appreciated. No clubbing, cyanosis or edema.  Skin - Warm and dry, without any rashes or other lesions.  Neuro - Alert and oriented x 3. Cranial nerves II-XII are grossly intact.  Moving all extremities symmetrically. Normal gait.  Psych - Normal mood and affect. Behavior is normal     MUSCULOSKELETAL: Strength intact in fingers.  Patient Data   Results  RADIOLOGY  Right hand X-ray: No acute dislocation (08/04/2024)  Labs Ordered/Reviewed - No data to display  XR HAND RIGHT   Final Result by Edi, Radresults In (10/28 1305)   NO ACUTE FRACTURE OR DISLOCATION.       If acute hand or  wrist trauma is suspected and initial radiographs are negative or equivocal, repeat radiographs in 10-14 days, MRI without IV contrast, or CT without IV contrast is usually appropriate as the next imaging study. (ACR Appropriateness Criteria: Acute Hand and Wrist Trauma, 2018)                Radiologist location ID: [id number]           Medical Decision Making          Medical Decision Making  A 43 year old female presented after a fall with right hand and finger pain, weakness in the third and fourth fingers, and mild right trapezius muscle discomfort. Examination revealed intact sensation and movement in the affected fingers, with mild tenderness in the right trapezius. X-ray of the right hand showed no acute dislocation. The patient declined additional pain medication beyond Tylenol.    Differential diagnosis includes, but is not limited to:  - Right hand and finger strain: The patient experienced pain and weakness in the right hand and fingers after a fall, with normal sensation and movement, and imaging showed no acute dislocation, supporting a diagnosis of soft tissue strain.  - Right trapezius muscle strain: The patient reported mild right trapezius discomfort after the fall, consistent with a muscle strain and without evidence of more serious injury.    Right hand and finger strain; Right trapezius muscle strain  -  Buddy taped affected fingers  - Prescribed Robaxin for muscle relaxation  - Instructed to take Tylenol for pain as needed  - Advised follow-up with primary care physician  - Discharged home  Medical Decision Making  Amount and/or Complexity of Data Reviewed  Radiology:  Decision-making details documented in ED Course.    Risk  Prescription drug management.      ED Course as of 08/04/24 1401   Tue Aug 04, 2024   1323 XR HAND RIGHT  No visible fracture.  No suspicious bone lesion.  Normal alignment.  Soft tissues are unremarkable.        IMPRESSION:  NO ACUTE FRACTURE OR DISLOCATION.      If acute  hand or wrist trauma is suspected and initial radiographs are negative or equivocal, repeat radiographs in 10-14 days, MRI without IV contrast, or CT without IV contrast is usually appropriate as the next imaging study. (ACR Appropriateness Criteria: Acute Hand and Wrist Trauma, 2018)               Medications Ordered/Administered in the ED   dexAMETHasone  4 mg/mL injection (4 mg IntraMUSCULAR Given 08/04/24 1330)     Clinical Impression   Pain of right hand (Primary)   Finger sprain       Disposition: Discharged           This note was created with assistance from Abridge via capture of conversational audio.  Consent was obtained from the patient prior to recording.

## 2024-08-05 ENCOUNTER — Telehealth (HOSPITAL_COMMUNITY): Payer: Self-pay | Admitting: Family

## 2024-08-05 NOTE — Telephone Encounter (Signed)
 Post Ed Follow-Up    Post ED Follow-Up:   Document completed and/or attempted interactive contact(s) after transition to home after emergency department stay.:   Transition Facility and relevant Date:   Discharge Date: 08/04/24  Discharge from [facility] Emergency Department?: Yes  Discharge Facility: [facility]  Contacted by: Nichole Lulas RN  Contact method: MyChart Patient Portal  MyChart message sent?: Yes  Medications prescribed: Yes  Interventions: No needs identified  MyChart message sent re: ED discharge requesting call back for questions/concerns. Advised of Nurse Navigator number and services provided.

## 2024-08-10 ENCOUNTER — Other Ambulatory Visit (INDEPENDENT_AMBULATORY_CARE_PROVIDER_SITE_OTHER): Payer: Self-pay

## 2024-08-17 ENCOUNTER — Ambulatory Visit (INDEPENDENT_AMBULATORY_CARE_PROVIDER_SITE_OTHER)

## 2024-08-17 ENCOUNTER — Encounter (INDEPENDENT_AMBULATORY_CARE_PROVIDER_SITE_OTHER): Payer: Self-pay | Admitting: Nephrology

## 2024-08-17 ENCOUNTER — Other Ambulatory Visit: Payer: Self-pay

## 2024-08-17 ENCOUNTER — Other Ambulatory Visit: Payer: Self-pay | Attending: Nephrology | Admitting: Nephrology

## 2024-08-17 DIAGNOSIS — N281 Cyst of kidney, acquired: Secondary | ICD-10-CM | POA: Insufficient documentation

## 2024-08-17 DIAGNOSIS — E559 Vitamin D deficiency, unspecified: Secondary | ICD-10-CM | POA: Insufficient documentation

## 2024-08-17 DIAGNOSIS — Q613 Polycystic kidney, unspecified: Secondary | ICD-10-CM | POA: Insufficient documentation

## 2024-08-17 LAB — CBC WITH DIFF
BASOPHIL #: 0.1 x10ˆ3/uL (ref 0.00–0.10)
BASOPHIL %: 1 % (ref 0–1)
EOSINOPHIL #: 0.2 x10ˆ3/uL (ref 0.00–0.50)
EOSINOPHIL %: 2 % (ref 1–7)
HCT: 42.3 % — ABNORMAL HIGH (ref 31.2–41.9)
HGB: 14.5 g/dL — ABNORMAL HIGH (ref 10.9–14.3)
LYMPHOCYTE #: 1.8 x10ˆ3/uL (ref 1.10–3.10)
LYMPHOCYTE %: 21 % (ref 16–46)
MCH: 30.4 pg (ref 24.7–32.8)
MCHC: 34.3 g/dL (ref 32.3–35.6)
MCV: 88.8 fL (ref 75.5–95.3)
MONOCYTE #: 0.4 x10ˆ3/uL (ref 0.20–0.90)
MONOCYTE %: 5 % (ref 4–11)
MPV: 8.3 fL (ref 7.9–10.8)
NEUTROPHIL #: 6.1 x10ˆ3/uL (ref 1.90–8.20)
NEUTROPHIL %: 72 % (ref 43–77)
PLATELETS: 262 x10ˆ3/uL (ref 140–440)
RBC: 4.77 x10ˆ6/uL (ref 3.63–4.92)
RDW: 13.4 % (ref 12.3–17.7)
WBC: 8.6 x10ˆ3/uL (ref 3.8–11.8)

## 2024-08-17 LAB — VITAMIN D 25 TOTAL: VITAMIN D 25, TOTAL: 55.24 ng/mL (ref 30.00–100.00)

## 2024-08-17 LAB — MICROALBUMIN/CREATININE RATIO, URINE, RANDOM
CREATININE RANDOM URINE: 33 mg/dL (ref 30–125)
MICROALBUMIN RANDOM URINE: 37.8 mg/dL
MICROALBUMIN/CREATININE RATIO RANDOM URINE: 1145.5 mg/g

## 2024-08-17 LAB — BASIC METABOLIC PANEL
ANION GAP: 9 mmol/L (ref 4–13)
BUN/CREA RATIO: 18 (ref 6–22)
BUN: 22 mg/dL (ref 7–25)
CALCIUM: 9.7 mg/dL (ref 8.6–10.3)
CHLORIDE: 107 mmol/L (ref 98–107)
CO2 TOTAL: 22 mmol/L (ref 21–31)
CREATININE: 1.25 mg/dL (ref 0.60–1.30)
ESTIMATED GFR: 54 mL/min/1.73mˆ2 — ABNORMAL LOW (ref >59)
GLUCOSE: 124 mg/dL — ABNORMAL HIGH (ref 74–109)
OSMOLALITY, CALCULATED: 281 mosm/kg (ref 270–290)
POTASSIUM: 3.3 mmol/L — ABNORMAL LOW (ref 3.5–5.1)
SODIUM: 138 mmol/L (ref 136–145)

## 2024-08-17 LAB — MAGNESIUM: MAGNESIUM: 2.1 mg/dL (ref 1.9–2.7)

## 2024-08-17 LAB — URIC ACID: URIC ACID: 5.3 mg/dL (ref 2.3–7.6)

## 2024-08-17 LAB — PROTEIN/CREATININE RATIO, URINE, RANDOM
CREATININE RANDOM URINE: 33 mg/dL (ref 30–125)
PROTEIN RANDOM URINE: 51 mg/dL (ref 50–80)
PROTEIN/CREATININE RATIO RANDOM URINE: 1.545 mg/mg (ref 0.000–200.000)

## 2024-08-17 LAB — PARATHYROID HORMONE (PTH): PTH: 38.8 pg/mL (ref 12.0–88.0)

## 2024-08-24 ENCOUNTER — Encounter (INDEPENDENT_AMBULATORY_CARE_PROVIDER_SITE_OTHER): Payer: Self-pay | Admitting: Nephrology

## 2024-08-24 ENCOUNTER — Ambulatory Visit (INDEPENDENT_AMBULATORY_CARE_PROVIDER_SITE_OTHER): Admitting: Nephrology

## 2024-08-24 ENCOUNTER — Other Ambulatory Visit: Payer: Self-pay

## 2024-08-24 VITALS — BP 110/87 | HR 93 | Ht 60.0 in | Wt 119.0 lb

## 2024-08-24 DIAGNOSIS — N1831 Chronic kidney disease, stage 3a: Secondary | ICD-10-CM

## 2024-08-24 DIAGNOSIS — M549 Dorsalgia, unspecified: Secondary | ICD-10-CM

## 2024-08-24 DIAGNOSIS — E559 Vitamin D deficiency, unspecified: Secondary | ICD-10-CM

## 2024-08-24 DIAGNOSIS — E876 Hypokalemia: Secondary | ICD-10-CM

## 2024-08-24 DIAGNOSIS — O24419 Gestational diabetes mellitus in pregnancy, unspecified control: Secondary | ICD-10-CM

## 2024-08-24 DIAGNOSIS — R809 Proteinuria, unspecified: Secondary | ICD-10-CM

## 2024-08-24 DIAGNOSIS — Z7984 Long term (current) use of oral hypoglycemic drugs: Secondary | ICD-10-CM

## 2024-08-24 DIAGNOSIS — I129 Hypertensive chronic kidney disease with stage 1 through stage 4 chronic kidney disease, or unspecified chronic kidney disease: Secondary | ICD-10-CM

## 2024-08-24 DIAGNOSIS — N281 Cyst of kidney, acquired: Secondary | ICD-10-CM

## 2024-08-24 DIAGNOSIS — D631 Anemia in chronic kidney disease: Secondary | ICD-10-CM

## 2024-08-24 DIAGNOSIS — Z8419 Family history of other disorders of kidney and ureter: Secondary | ICD-10-CM

## 2024-08-24 DIAGNOSIS — M545 Low back pain, unspecified: Secondary | ICD-10-CM

## 2024-08-24 DIAGNOSIS — N182 Chronic kidney disease, stage 2 (mild): Secondary | ICD-10-CM

## 2024-08-24 DIAGNOSIS — E8881 Metabolic syndrome: Secondary | ICD-10-CM

## 2024-08-24 DIAGNOSIS — Z8632 Personal history of gestational diabetes: Secondary | ICD-10-CM

## 2024-08-24 DIAGNOSIS — Z7985 Long-term (current) use of injectable non-insulin antidiabetic drugs: Secondary | ICD-10-CM

## 2024-08-24 DIAGNOSIS — Q613 Polycystic kidney, unspecified: Secondary | ICD-10-CM

## 2024-08-24 MED ORDER — CYCLOBENZAPRINE 5 MG TABLET: 5.0000 mg | ORAL_TABLET | Freq: Two times a day (BID) | ORAL | 0 refills | Status: DC

## 2024-08-24 MED ORDER — PROPRANOLOL 10 MG TABLET
10.0000 mg | ORAL_TABLET | Freq: Every evening | ORAL | 3 refills | Status: AC
Start: 2024-08-24 — End: 2025-08-24

## 2024-08-24 NOTE — Progress Notes (Signed)
 NEPHROLOGY, [facility]  [address]  [place] [address]    Progress Note    Name: Sara Grant MRN:  [id number]   Date: 08/24/2024 DOB:  02/10/1981 (43 y.o.)              Nephrology Out  Patient Visit       Reason for the visit/consultation:  Proteinuria  HPI : 43 y.o. female very pleasant with the physical therapy with past medical history of hypertension since age 46, dyslipidemia, gestational diabetes, chronic proteinuria and bladder infection since she was very young no recent bladder infection presenting to the clinic to establish care.    Patient denies edema.  No shortness of breath no chest pain.  Patient reports since she was very young she had history of protein in the urine blood in the urine and on and off bladder infections.  Patient denies chest pain.  No shortness of breath.  No fevers or chills.  Patient takes NSAIDs p.r.n. discussed with her stopping NSAIDs completely patient is on Mounjaro  once a month.    Patient did not tolerate Jardiance  everyday so she is now taking it twice a week due to episodes of low blood sugar.    Past medical history hypertension, gestational diabetes, anxiety, dyslipidemia, cysts on the kidneys, proteinuria.    Family history positive for kidney disease in her mother who was on dialysis at age 67 and passed away   Social history no smoking no alcohol no recreational drugs    Lab work from other lab:  1.0 GFR 68  ROS:     Systematic review of 12 organ systems was negative except what mentioned in in the HPI.     OBJECTIVE:   BP 110/87   Pulse 93   Ht 1.524 m (5')   Wt 54 kg (119 lb)   BMI 23.24 kg/m       General:  NAD, AAOx3  HEENT:  EOMI,  no icterus  NECK: No increased JVD.  Normal inspection   HEART: Normal S1 and S2. Regular rhythm. No murmurs or rubs. No chest wall tenderness   LUNGS: Clear to auscultation bilateral. No wheezes, rales, or rhonchi.   ABDOMEN: +BS, Soft, nontender and nondistended. No rebound or guarding present.    EXTREMITIES: No edema. No cyanosis or clubbing.No asterixis    NEURO : Normal speech, EOMI.       LABORATORY DATA:   Lab Results   Component Value Date    BUN 22 08/17/2024    BUN 19 03/14/2024    CREATININE 1.25 08/17/2024    CREATININE 1.03 03/14/2024    BUNCRRATIO 18 08/17/2024    BUNCRRATIO 18 03/14/2024    GFR 54 (L) 08/17/2024    GFR 68 03/14/2024    SODIUM 138 08/17/2024    SODIUM 135 (L) 03/14/2024    SODIUM 138 01/08/2022    POTASSIUM 3.3 (L) 08/17/2024    POTASSIUM 3.4 (L) 03/14/2024    POTASSIUM 4.0 01/08/2022    CHLORIDE 107 08/17/2024    CHLORIDE 107 03/14/2024    CHLORIDE 107 01/08/2022    CO2 22 08/17/2024    CO2 26 03/14/2024    CO2 23 01/08/2022    ANIONGAP 9 08/17/2024    ANIONGAP 2 (L) 03/14/2024    ANIONGAP 8 (L) 01/08/2022    CALCIUM 9.7 08/17/2024    CALCIUM 9.1 03/14/2024    ALBUMIN 3.9 01/08/2022    HGB 14.5 (H) 08/17/2024    HGB 14.1  03/14/2024    HCT 42.3 (H) 08/17/2024    HCT 41.2 03/14/2024    INTACTPTH 38.8 08/17/2024    INTACTPTH 47.7 03/14/2024           MEDICATIONS:  Outpatient Medications Marked as Taking for the 08/24/24 encounter (Office Visit) with Helene Meter, MD   Medication Sig    cyclobenzaprine (FLEXERIL) 5 mg Oral Tablet Take 1 Tablet (5 mg total) by mouth Twice daily for 30 days    propranoloL (INDERAL) 10 mg Oral Tablet Take 1 Tablet (10 mg total) by mouth Every night     Current Outpatient Medications   Medication Instructions    atorvastatin (LIPITOR) 20 mg, Daily    buPROPion (WELLBUTRIN XL) 150 mg extended release 24 hr tablet     busPIRone (BUSPAR) 15 mg, 2 TIMES DAILY    cholecalciferol (vitamin D3) 10,000 Units, Daily    citalopram (CELEXA) 40 mg, Daily    cyclobenzaprine (FLEXERIL) 5 mg, Oral, 2 TIMES DAILY    empagliflozin  (JARDIANCE ) 10 mg, Oral, Daily    loratadine (CLARITIN) 10 mg, Oral, Daily    LORazepam (ATIVAN) 0.5 mg, 2 TIMES DAILY PRN    losartan-hydrochlorothiazide (HYZAAR) 50-12.5 mg Oral Tablet No dose, route, or frequency recorded.    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 Proteinuria  R80.9   5. Gestational diabetes  O24.419   6. Back pain  M54.9            Chronic kidney disease  -Stage II +proteinuria  -Due to hypertension  -Creatinine is 1.25, GFR 68, 54  Lab Results   Component Value Date    CREATININE 1.25 08/17/2024    CREATININE 1.03 03/14/2024        -Baseline creatinine 1.0  -Total protein to creatinine ratio-  Lab Results   Component Value Date    UPCR 1.545 08/17/2024    UPCR 1.734 03/14/2024      -UACR 1225, 1145  -CBC and a basic metabolic panel  -Return to clinic in 3 months  -Continue low-sodium diet  -balanced Fluid intake 40-50 oz a day.    -Avoid NSAIDs.  Tylenol only for pain  -Renal imaging, CT scan indicating cysts but no polycystic kidney disease  -ACEI or ARBS:  Yes losartan with HCTZ  -Sodium Glucose Cotransporter-2 (SGLT2) Inhibitors:  Jardiance  on Mondays and Fridays  -continue Mounjaro  5 once a month.  -continue propranolol for anxiety and hypertension, increase the dose to 40 in the morning 10 in the evening  -we will arrange for CT kidney biopsy in the setting of unexplained proteinuria    Hypokalemia  -due to HCTZ we will replace.    Proteinuria  -Jardiance  continue losartan if not better  -kidney biopsy    CKD bone mineral disease  Lab Results   Component Value Date    INTACTPTH 38.8 08/17/2024    VITAMIND25 55.24 08/17/2024        Vitamin-D deficiency noted we will replace     Hypertension  -Blood pressure is acceptable diastolic blood pressure still elevated we will increase propranolol  -Goal less than 130/80  -  Continue losartan 50 with HCTZ 12.5  -Low-salt diet    History of gestational diabetes  -monitor blood sugars.  May check A1c down the  road    Metabolic syndrome rule out gout  -Check uric acid   Lab Results   Component Value Date    URICACID 5.3 08/17/2024       -Low uric acid diet  -continue current management    Anemia of CKD  -no  - HB   Lab Results   Component Value Date    HGB 14.5 (H) 08/17/2024           Hypokalemia  -high potassium diet.  Remains on HCTZ.  May need to switch to Aldactone.      Lower back pain  -referral  -Flexeril    Orders Placed This Encounter    BASIC METABOLIC PANEL    CBC/DIFF    MAGNESIUM    MICROALBUMIN/CREATININE RATIO, URINE, RANDOM    PARATHYROID  HORMONE (PTH)    PROTEIN/CREATININE RATIO, URINE, RANDOM    URIC ACID    VITAMIN D  25 TOTAL    HGA1C (HEMOGLOBIN A1C WITH EST AVG GLUCOSE)    Referral to PAIN MANAGEMENT - [place] [facility] [place]    CT BIOPSY RENAL    propranoloL (INDERAL) 10 mg Oral Tablet    cyclobenzaprine (FLEXERIL) 5 mg Oral Tablet        Shaden Lacher, MD

## 2024-09-01 ENCOUNTER — Ambulatory Visit (INDEPENDENT_AMBULATORY_CARE_PROVIDER_SITE_OTHER)

## 2024-09-02 ENCOUNTER — Ambulatory Visit
Admission: RE | Admit: 2024-09-02 | Discharge: 2024-09-02 | Disposition: A | Discharge status: Home / Self Care | Payer: Self-pay | Source: Ambulatory Visit | Attending: Nephrology | Admitting: Nephrology

## 2024-09-02 ENCOUNTER — Other Ambulatory Visit: Payer: Self-pay

## 2024-09-09 ENCOUNTER — Telehealth (HOSPITAL_COMMUNITY): Payer: Self-pay | Admitting: Vascular & Interventional Radiology

## 2024-09-09 NOTE — OR PreOp (Signed)
 Spoke with pt regarding IR procedure   NKA  Denies blood thinners.   Pt stopped mounjaro  x 3 weeks.   Pt instructed to take BP/heart medication morning of procedure with sip of water.   Pt denies problems with sedation/anesthesia.   Pt knows to be NPO and will have a driver day of procedure.

## 2024-09-10 ENCOUNTER — Other Ambulatory Visit: Payer: Self-pay

## 2024-09-10 ENCOUNTER — Other Ambulatory Visit (INDEPENDENT_AMBULATORY_CARE_PROVIDER_SITE_OTHER): Payer: Self-pay | Admitting: Nephrology

## 2024-09-10 ENCOUNTER — Encounter (HOSPITAL_COMMUNITY): Payer: Self-pay

## 2024-09-10 ENCOUNTER — Ambulatory Visit (HOSPITAL_COMMUNITY)
Admission: RE | Admit: 2024-09-10 | Discharge: 2024-09-10 | Disposition: A | Discharge status: Home / Self Care | Source: Ambulatory Visit

## 2024-09-10 ENCOUNTER — Ambulatory Visit
Admission: RE | Admit: 2024-09-10 | Discharge: 2024-09-10 | Disposition: A | Discharge status: Home / Self Care | Payer: Self-pay | Source: Ambulatory Visit | Attending: Nephrology | Admitting: Nephrology

## 2024-09-10 DIAGNOSIS — I129 Hypertensive chronic kidney disease with stage 1 through stage 4 chronic kidney disease, or unspecified chronic kidney disease: Secondary | ICD-10-CM | POA: Insufficient documentation

## 2024-09-10 DIAGNOSIS — N99841 Postprocedural hematoma of a genitourinary system organ or structure following other procedure: Secondary | ICD-10-CM | POA: Insufficient documentation

## 2024-09-10 DIAGNOSIS — R809 Proteinuria, unspecified: Secondary | ICD-10-CM

## 2024-09-10 DIAGNOSIS — N189 Chronic kidney disease, unspecified: Secondary | ICD-10-CM | POA: Insufficient documentation

## 2024-09-10 DIAGNOSIS — N281 Cyst of kidney, acquired: Secondary | ICD-10-CM

## 2024-09-10 DIAGNOSIS — N280 Ischemia and infarction of kidney: Secondary | ICD-10-CM | POA: Insufficient documentation

## 2024-09-10 DIAGNOSIS — N3289 Other specified disorders of bladder: Secondary | ICD-10-CM

## 2024-09-10 LAB — H & H
HCT: 42.5 % — ABNORMAL HIGH (ref 31.2–41.9)
HGB: 14.5 g/dL — ABNORMAL HIGH (ref 10.9–14.3)

## 2024-09-10 MED ORDER — FENTANYL (PF) 50 MCG/ML INJECTION SOLUTION
INTRAMUSCULAR | Status: AC
  Filled 2024-09-10: qty 2

## 2024-09-10 MED ORDER — FENTANYL (PF) 50 MCG/ML INJECTION WRAPPER
INJECTION | Freq: Once | INTRAMUSCULAR | Status: AC | PRN
  Administered 2024-09-10: 10 ug via INTRAVENOUS
  Administered 2024-09-10: 20 ug via INTRAVENOUS

## 2024-09-10 MED ORDER — MIDAZOLAM 5 MG/ML INJECTION WRAPPER
INTRAMUSCULAR | Status: AC
  Filled 2024-09-10: qty 1

## 2024-09-10 MED ORDER — SODIUM CHLORIDE 0.9 % INTRAVENOUS SOLUTION: INTRAVENOUS | Status: DC

## 2024-09-10 MED ORDER — HYDROCODONE 7.5 MG-ACETAMINOPHEN 325 MG TABLET: 1.0000 | ORAL_TABLET | ORAL | Status: DC | PRN

## 2024-09-10 MED ORDER — LIDOCAINE HCL 20 MG/ML (2 %) INJECTION SOLUTION
Freq: Once | INTRAMUSCULAR | Status: AC | PRN
  Administered 2024-09-10: 10 mL via INTRADERMAL

## 2024-09-10 MED ORDER — SODIUM CHLORIDE 0.9 % INJECTION SOLUTION
INTRAMUSCULAR | Status: AC
  Filled 2024-09-10: qty 10

## 2024-09-10 MED ORDER — MIDAZOLAM 5 MG/ML INJECTION WRAPPER
Freq: Once | INTRAMUSCULAR | Status: AC | PRN
  Administered 2024-09-10 (×2): 1 mg via INTRAVENOUS

## 2024-09-10 NOTE — Sedation Documentation (Signed)
 09/10/2024  * No procedures listed * CT-guided left renal biopsy with Gel-Foam embolization    Diagnosis:  Proteinuria    Sedation Informed Consent, pre-sedation risk assessment and evaluation completed.  History of previous adverse experiences with sedation/analgesia/anesthesia assessed.  Monitored conscious sedation was administered under my direct supervision by an appropriately trained sedation nurse.  Appropriate Facility and Equipment compliant.      Procedure time out  Timeouts       Shorter, Alyssa, RN at [facility] Sep 10, 2024 9345 EST       Timeout Details       Timeout type: Preprocedure              Procedures       CT BIOPSY RENAL              Timeout Questions    Correct patient? Yes  Correct site? Yes  Correct side? N/A  Correct position? No  Correct procedure? Yes  Site marked? No  H&P note completed? Yes  Consents verified? No  Radiology studies available? N/A  Relevant lab results available? N/A  Are all required blood products & devices for the procedure available? N/A  Is documentation verified? N/A             Staff Present       Staff  Shorter, Alyssa, RN              Signing History       Staff Performed Signed    Shorter, Alyssa, RN Thu Sep 10, 2024 9345 EST Thu Sep 10, 2024 9345 EST                            Physician in  and out times  Physicians    None       Sedation Staff    None         Sedation and Procedure Times:  Event Time In   CV Sedation Start [phone]   CV Sedation Stop 0845             Aldrete Scores    Pre Sedation  Activity: 2-->able to move 4 extremities voluntarily or on command  Respiration: 2-->able to breathe and cough freely  Circulation: 2-->BP within 20% of pre-anesthetic level  Consciousness: 2-->fully awake  O2 Saturation: 2-->able to maintain O2 saturation greater than 92% on room air  Dressing: 2-->dry and clean or not applicable  Pain: 2-->pain free  Ambulation: 2-->able to stand up and walk straight, on ordered bedrest, or performing at previous level of  functioning  Fasting/Feeding: 2-->able to drink fluids or NPO, minimal nausea/ no vomiting  Urine Output: 2-->has voided, adequate urine output per device, or not applicable  Modified Aldrete Score: 20      Post Sedation                                             Medications (moderate): Fentanyl , Versed   Hospital: [facility]  Unit: Interventional Radiology  IV Type: Peripheral IV  Additional Intervention needed:: No     Effects of Administration: Successful sedation w/o adverse events       Patient was continuously monitored throughout the procedure.  Provider was in attendance throughout sedation.  See Invasive Procedure Log for additional details.    Alm LITTIE Pastor,  MD

## 2024-09-10 NOTE — Nurses Notes (Signed)
 1300: to radiology for repeat CT.

## 2024-09-10 NOTE — Brief Op Note (Signed)
 PRN IR POST OP: 09/10/2024      Radiologist: Dr. Alm LITTIE Pastor    Procedure: CT BIOPSY RENAL with Gel-Foam embolization    Description of Procedure Findings:      Proteinuria        Estimated Blood Loss:  10 mL        Specimen Collected:  3 core biopsy specimens            Diagnosis: Proteinuria; as above; dictator report to follow      Alm LITTIE Pastor, MD

## 2024-09-10 NOTE — Nurses Notes (Signed)
 Dr. Amil notified of H&H.

## 2024-09-10 NOTE — Nurses Notes (Signed)
 Report called to [facility] in Day Surgery.

## 2024-09-10 NOTE — Discharge Instructions (Addendum)
 SURGICAL DISCHARGE INSTRUCTIONS     Dr. Amil performed your CT Renal biopsy. today at the [facility]    [place]  [facility]:  Monday through Friday from 8 a.m. - 4 p.m.: [phone]    For T&D: [phone]  Between 4 p.m. - 8 a.m., weekends and holidays:  Call ER [phone]    PLEASE SEE WRITTEN HANDOUTS AS DISCUSSED BY YOUR NURSE:      SIGNS AND SYMPTOMS OF A WOUND / INCISION INFECTION   Be sure to watch for the following:  Increase in redness or red streaks near or around the wound or incision.  Increase in pain that is intense or severe and cannot be relieved by the pain medication that your doctor has given you.  Increase in swelling that cannot be relieved by elevation of a body part, or by applying ice, if permitted.  Increase in drainage, or if yellow / green in color and smells bad. This could be on a dressing or a cast.  Increase in fever for longer than 24 hours, or an increase that is higher than 101 degrees Fahrenheit (normal body temperature is 98 degrees Fahrenheit). The incision may feel warm to the touch.    **CALL YOUR DOCTOR IF ONE OR MORE OF THESE SIGNS / SYMPTOMS SHOULD OCCUR.    ANESTHESIA INFORMATION   ANESTHESIA -- ADULT PATIENTS:  You have received intravenous sedation / general anesthesia, and you may feel drowsy and light-headed for several hours. You may even experience some forgetfulness of the procedure. DO NOT DRIVE A MOTOR VEHICLE or perform any activity requiring complete alertness or coordination until you feel fully awake in about 24-48 hours. Do not drink alcoholic beverages for at least 24 hours. Do not stay alone, you must have a responsible adult available to be with you. You may also experience a dry mouth or nausea for 24 hours. This is a normal side effect and will disappear as the effects of the medication wear off.    REMEMBER   If you experience any difficulty breathing, chest pain, bleeding that you feel is excessive, persistent  nausea or vomiting or for any other concerns:  Contact your doctor who ordered the biopsy or proceed to the closest Emergency Department      SPECIAL INSTRUCTIONS / COMMENTS   No working, driving, cooking, or cleaning. Take the rest of the day off. Resume normal schedule tomorrow.     FOLLOW-UP APPOINTMENTS   The doctor who ordered your biopsy will contact you with your results and set up your next appointment.

## 2024-09-15 ENCOUNTER — Other Ambulatory Visit: Payer: Self-pay

## 2024-09-15 ENCOUNTER — Ambulatory Visit (INDEPENDENT_AMBULATORY_CARE_PROVIDER_SITE_OTHER)

## 2024-09-15 DIAGNOSIS — M4722 Other spondylosis with radiculopathy, cervical region: Secondary | ICD-10-CM

## 2024-09-15 DIAGNOSIS — M9905 Segmental and somatic dysfunction of pelvic region: Secondary | ICD-10-CM

## 2024-09-15 DIAGNOSIS — M549 Dorsalgia, unspecified: Secondary | ICD-10-CM

## 2024-09-15 DIAGNOSIS — J986 Disorders of diaphragm: Secondary | ICD-10-CM

## 2024-09-15 NOTE — H&P (Unsigned)
 [facility]  [address]  [place] [address]    History and Physical    Name: Sara Grant MRN:  [id number]   Date: 09/15/2024 DOB:  04/21/1981 (43 y.o.)               Provider: Omega KATHEE Sides, DO  PCP: Elouise Stabs, APRN, CNP  Referring Provider: Dominique Hey     Reason for visit: Low Back Pain and Neck Pain      History of Present Illness  Sara Grant is a 43 year old female who presents for follow-up of a neck injury sustained in July 2024.    In July 2024, she sustained a neck injury while working at [employer] when a patient fell and she attempted to catch her. This resulted in a neck injury, which she described as a strain. Initially, she sought care from Dr. Tobie in [place] but was unable to follow up due to moving back and contracting COVID-19.    Since the injury, her symptoms have improved by 30-40%, but she experiences flare-ups if she sleeps poorly or engages in lifting or straining activities. These flare-ups occur approximately once a month and last for about three to four days. She uses a brace, exercises, self-massage, lidocaine  roll-on, and Tylenol  for symptom management. She also mentioned using a neck traction device and has had some physical therapy sessions which she found helpful.    She experiences occasional right arm symptoms, including heaviness, but is not currently experiencing these symptoms.    She engages in stretching and dancing for exercise, with a more structured exercise routine from September to December 2024, which included cross-training at [employer]. Since December, her exercise routine has reduced to about 15 minutes, three times a week.         Past Medical History:  Past Medical History:   Diagnosis Date    Anxiety     Chronic urinary tract infection     CKD (chronic kidney disease) stage 3, GFR 30-59 ml/min     Depression     Hypertension     Microscopic hematuria     OAB (overactive bladder)      Past Surgical  History:   Procedure Laterality Date    HX CESAREAN SECTION      x2    HX UROSTOMY        Social History     Socioeconomic History    Marital status: Married   Tobacco Use    Smoking status: Never    Smokeless tobacco: Never   Vaping Use    Vaping status: Never Used   Substance and Sexual Activity    Alcohol use: Yes     Comment: Occasional    Drug use: Never     Social Determinants of Health     Financial Resource Strain: Low Risk (04/05/2022)    Financial Resource Strain     SDOH Financial: No   Transportation Needs: Low Risk (04/05/2022)    Transportation Needs     SDOH Transportation: No   Social Connections: Low Risk (04/05/2022)    Social Connections     SDOH Social Isolation: 5 or more times a week   Intimate Partner Violence: Low Risk (04/05/2022)    Intimate Partner Violence     SDOH Domestic Violence: No   Housing Stability: Low Risk (04/05/2022)    Housing Stability     SDOH Housing Situation: I have housing.  SDOH Housing Worry: No       Objective:      Physical Exam:  Vital Signs:    Gait:  Intact    Posture:  Relative flat thoracic    Squat mechanics:  Grade 3    Scapular mechanics:   Sagittal plane:  Full posterior tilt with moderate upper trap substitution bilaterally     Frontal plane:  Full upward rotation with moderate upper trap substitution bilaterally     Transverse plane:  Mild decreased left internal rotation of the scapula    Spinal exam:   Cervical:  Flexion is full without pain.  Extension is full without pain.  Left side bending is full with left periscapular symptoms.  Right side bending is limited 25% with right upper limb symptoms.  Left rotation and right rotation are full without pain.     Thoracic:  Extension is full and flexion is limited approximately 50%     Lumbar:    Rib/Diaphragm:  Positive apical expansion bilaterally    Pelvis/Sacrum:  Positive abductor drop test bilaterally    Hip:    Knee:    Foot/ankle:    Shoulder:    Elbow:    Wrist/hand:    Neuro  exam:   Motor:    Cervical:  5/5 for bilateral C5 through T1 myotomes       Lumbar:     Sensory:    Cervical:  Intact pinprick for bilateral C5 through T1 dermatomes        Lumbar:   Reflex:    Biceps:  2+ bilaterally    Brachioradialis:  2+ bilaterally    Triceps:    Patella:  2+ bilaterally    Medial Hamstring:    Achilles:  2+ bilaterally       Hoffman's:  Negative bilaterally    Tromners:    Wartenberg thumb adduction:    Babinski:  Downgoing bilaterally    Ankle clonus:       Tone:     Rectal:       Special Nerve tests:   Tinels:      Phalen's:  Positive on the left at 23 seconds.  Positive on the right at 12 seconds.     Cervical foraminal traction/compression test:      Upper extremity abduction relief:     Slump:     SLR:     Prone knee bend:     Other:         Osteopathic exam:    Capsular/muscle imbalance:   Tight:     Inhibited:  Deep neck flexors    Physical Exam  Ortho Exam  Objective   Neurological Exam     DIAGNOSTICS:      Results  RADIOLOGY  Cervical spine MRI: Significant narrowing at C6-C7 level foramen    PATHOLOGY  Biopsy of the left side kidney    Images reviewed: y  Results reviewed: y  DOS: 05/16/2023  Date of Review: 09/21/24  Study: cervical mri  C1-C2: nl  C2-C3: nl  C3-C4:  nl  C4-C5: Normal disc height with lipping of the inferior endplate C4 vertebral body anteriorly.  No foraminal stenosis at this level.  No canal stenosis.  C5-C6:  Mild to moderate degenerative changes of the disc.  Moderate right foraminal stenosis +mild to moderate left foraminal stenosis +mild canal stenosis  C6-C7:  Severe right foraminal stenosis moderate-to-severe left foraminal stenosis.  Severe degenerative disc disease +mild to moderate spondylosis.  No canal  stenosis.  C7-T1: nl  Additional: flattening of  lordosis              Assessment & Plan  Cervical spondylosis with intermittent radiculopathy, C6-C7  Chronic cervical spondylosis with intermittent radiculopathy at C6-C7. MRI shows significant narrowing.  Symptoms improved by 30-40% since July 2024. No current arm symptoms. No surgery needed. Discussed potential epidural steroid injection with 75% efficacy if significant arm symptoms develop. Regular exercise can decrease flare-up risk by 50%.  - Ordered EMG to assess for carpal tunnel syndrome and severity.  - Initiate regular exercise routine with cross-training.  - Consider epidural steroid injection if significant arm symptoms develop.  - Educated on avoiding exercises that increase neck stress.  - Recommended [facility] exercises.  -cervical mri images/report reviewed 09/21/24    Suspected right carpal tunnel syndrome  Evidence of nerve irritation suggests carpal tunnel syndrome. Discussed double crush phenomenon. Ninety percent of cases cured with wrist splints and possibly a steroid injection.  - Ordered EMG to confirm carpal tunnel syndrome and assess severity.  - Recommended wearing wrist splints at night if arm symptoms develop.  - Consider steroid injection if confirmed and symptoms persist.    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 Discussed importance of addressing these dysfunctions.  - Recommended [facility] exercises.  - Consider referral to pelvic floor physical therapy if symptoms persist.                Javed Cotto B Treniece Holsclaw, DO     Portions of this note may be dictated using voice recognition software or a dictation service. Variances in spelling and vocabulary are possible and unintentional. Not all errors are caught/corrected. Please notify the dino if any discrepancies are noted or if the meaning of any statement is not clear.   This note was created with assistance from [employer] via capture of conversational audio. Consent was obtained from the patient and all parties present prior to recording.

## 2024-09-15 NOTE — Nursing Note (Signed)
 Patient comes into office today with a c/o neck pain and lower back pain. Patient rates her pain a 3/10 on numeric pain scale. Patient describes pain as a dull aching pain. Patient has completed physical therapy before with some relief to her pain. Patient takes tylenol  and flexeril  as needed for pain.     Isaiah Massing, LPN  [id number] 84:84

## 2024-09-16 LAB — SURGICAL PATHOLOGY SPECIMEN

## 2024-10-02 ENCOUNTER — Other Ambulatory Visit (INDEPENDENT_AMBULATORY_CARE_PROVIDER_SITE_OTHER): Payer: Self-pay | Admitting: Nephrology

## 2024-10-15 ENCOUNTER — Other Ambulatory Visit: Payer: Self-pay

## 2024-10-15 ENCOUNTER — Encounter (HOSPITAL_COMMUNITY): Payer: Self-pay

## 2024-10-15 ENCOUNTER — Ambulatory Visit
Admission: RE | Admit: 2024-10-15 | Discharge: 2024-10-15 | Disposition: A | Discharge status: Home / Self Care | Payer: Self-pay | Source: Ambulatory Visit

## 2024-10-15 DIAGNOSIS — Z1231 Encounter for screening mammogram for malignant neoplasm of breast: Secondary | ICD-10-CM | POA: Insufficient documentation

## 2024-11-03 ENCOUNTER — Other Ambulatory Visit (INDEPENDENT_AMBULATORY_CARE_PROVIDER_SITE_OTHER): Payer: Self-pay | Admitting: Nephrology

## 2024-11-13 ENCOUNTER — Encounter (INDEPENDENT_AMBULATORY_CARE_PROVIDER_SITE_OTHER): Payer: Self-pay

## 2024-11-25 ENCOUNTER — Ambulatory Visit (INDEPENDENT_AMBULATORY_CARE_PROVIDER_SITE_OTHER): Payer: Self-pay

## 2024-12-07 ENCOUNTER — Ambulatory Visit (INDEPENDENT_AMBULATORY_CARE_PROVIDER_SITE_OTHER): Payer: Self-pay

## 2024-12-31 ENCOUNTER — Encounter (INDEPENDENT_AMBULATORY_CARE_PROVIDER_SITE_OTHER): Payer: Self-pay | Admitting: Nephrology
